# Patient Record
Sex: Female | Born: 1945
Health system: Southern US, Community
[De-identification: ages and names within clinical notes are randomized; demographics above are authoritative.]

## PROBLEM LIST (undated history)

## (undated) DIAGNOSIS — I10 Essential (primary) hypertension: Secondary | ICD-10-CM

## (undated) DIAGNOSIS — R002 Palpitations: Secondary | ICD-10-CM

## (undated) DIAGNOSIS — K635 Polyp of colon: Secondary | ICD-10-CM

## (undated) DIAGNOSIS — C4491 Basal cell carcinoma of skin, unspecified: Secondary | ICD-10-CM

## (undated) DIAGNOSIS — E785 Hyperlipidemia, unspecified: Secondary | ICD-10-CM

## (undated) DIAGNOSIS — I471 Supraventricular tachycardia, unspecified: Secondary | ICD-10-CM

## (undated) DIAGNOSIS — I4719 Other supraventricular tachycardia: Secondary | ICD-10-CM

## (undated) DIAGNOSIS — M549 Dorsalgia, unspecified: Secondary | ICD-10-CM

## (undated) DIAGNOSIS — K589 Irritable bowel syndrome without diarrhea: Secondary | ICD-10-CM

## (undated) DIAGNOSIS — J309 Allergic rhinitis, unspecified: Secondary | ICD-10-CM

## (undated) DIAGNOSIS — C4492 Squamous cell carcinoma of skin, unspecified: Secondary | ICD-10-CM

## (undated) HISTORY — DX: Polyp of colon: K63.5

## (undated) HISTORY — DX: Palpitations: R00.2

## (undated) HISTORY — DX: Irritable bowel syndrome, unspecified: K58.9

## (undated) HISTORY — DX: Hypercalcemia: E83.52

## (undated) HISTORY — DX: Basal cell carcinoma of skin, unspecified: C44.91

## (undated) HISTORY — DX: Hyperlipidemia, unspecified: E78.5

## (undated) HISTORY — DX: Squamous cell carcinoma of skin, unspecified: C44.92

## (undated) HISTORY — DX: Allergic rhinitis, unspecified: J30.9

## (undated) HISTORY — DX: Supraventricular tachycardia: I47.1

## (undated) HISTORY — DX: Dorsalgia, unspecified: M54.9

## (undated) HISTORY — DX: Essential (primary) hypertension: I10

## (undated) HISTORY — DX: Other supraventricular tachycardia: I47.19

## (undated) HISTORY — DX: Supraventricular tachycardia, unspecified: I47.10

---

## 1975-03-05 HISTORY — PX: APPENDECTOMY: SHX54

## 1975-03-05 HISTORY — PX: CHOLECYSTECTOMY: SHX55

## 1998-07-05 ENCOUNTER — Other Ambulatory Visit: Admission: RE | Admit: 1998-07-05 | Discharge: 1998-07-05 | Payer: Self-pay | Admitting: *Deleted

## 1999-07-16 ENCOUNTER — Other Ambulatory Visit: Admission: RE | Admit: 1999-07-16 | Discharge: 1999-07-16 | Payer: Self-pay | Admitting: *Deleted

## 2000-07-22 ENCOUNTER — Other Ambulatory Visit: Admission: RE | Admit: 2000-07-22 | Discharge: 2000-07-22 | Payer: Self-pay | Admitting: *Deleted

## 2001-09-01 ENCOUNTER — Other Ambulatory Visit: Admission: RE | Admit: 2001-09-01 | Discharge: 2001-09-01 | Payer: Self-pay | Admitting: Obstetrics and Gynecology

## 2002-10-25 ENCOUNTER — Other Ambulatory Visit: Admission: RE | Admit: 2002-10-25 | Discharge: 2002-10-25 | Payer: Self-pay | Admitting: Obstetrics and Gynecology

## 2002-11-03 HISTORY — PX: ENDOMETRIAL BIOPSY: SHX622

## 2004-12-25 ENCOUNTER — Encounter: Admission: RE | Admit: 2004-12-25 | Discharge: 2004-12-25 | Payer: Self-pay | Admitting: Obstetrics and Gynecology

## 2007-04-29 ENCOUNTER — Encounter: Admission: RE | Admit: 2007-04-29 | Discharge: 2007-04-29 | Payer: Self-pay | Admitting: Obstetrics and Gynecology

## 2010-03-24 ENCOUNTER — Encounter: Payer: Self-pay | Admitting: Obstetrics and Gynecology

## 2013-05-21 ENCOUNTER — Encounter: Payer: Self-pay | Admitting: Cardiology

## 2013-05-21 ENCOUNTER — Encounter: Payer: Self-pay | Admitting: General Surgery

## 2013-05-21 ENCOUNTER — Ambulatory Visit (INDEPENDENT_AMBULATORY_CARE_PROVIDER_SITE_OTHER): Payer: Medicare Other | Admitting: Cardiology

## 2013-05-21 ENCOUNTER — Emergency Department (HOSPITAL_COMMUNITY): Payer: Medicare Other

## 2013-05-21 ENCOUNTER — Encounter (HOSPITAL_COMMUNITY): Payer: Self-pay | Admitting: Emergency Medicine

## 2013-05-21 ENCOUNTER — Emergency Department (HOSPITAL_COMMUNITY)
Admission: EM | Admit: 2013-05-21 | Discharge: 2013-05-21 | Disposition: A | Discharge status: Home / Self Care | Payer: Medicare Other | Attending: Emergency Medicine | Admitting: Emergency Medicine

## 2013-05-21 VITALS — BP 153/83 | HR 137 | Ht 67.0 in | Wt 192.0 lb

## 2013-05-21 DIAGNOSIS — Z860100 Personal history of colon polyps, unspecified: Secondary | ICD-10-CM | POA: Insufficient documentation

## 2013-05-21 DIAGNOSIS — Z79899 Other long term (current) drug therapy: Secondary | ICD-10-CM | POA: Diagnosis not present

## 2013-05-21 DIAGNOSIS — Z8639 Personal history of other endocrine, nutritional and metabolic disease: Secondary | ICD-10-CM | POA: Insufficient documentation

## 2013-05-21 DIAGNOSIS — I471 Supraventricular tachycardia: Secondary | ICD-10-CM

## 2013-05-21 DIAGNOSIS — R002 Palpitations: Secondary | ICD-10-CM | POA: Diagnosis not present

## 2013-05-21 DIAGNOSIS — Z8719 Personal history of other diseases of the digestive system: Secondary | ICD-10-CM | POA: Insufficient documentation

## 2013-05-21 DIAGNOSIS — Z880 Allergy status to penicillin: Secondary | ICD-10-CM | POA: Insufficient documentation

## 2013-05-21 DIAGNOSIS — Z85828 Personal history of other malignant neoplasm of skin: Secondary | ICD-10-CM | POA: Diagnosis not present

## 2013-05-21 DIAGNOSIS — Z862 Personal history of diseases of the blood and blood-forming organs and certain disorders involving the immune mechanism: Secondary | ICD-10-CM | POA: Insufficient documentation

## 2013-05-21 DIAGNOSIS — Z7982 Long term (current) use of aspirin: Secondary | ICD-10-CM | POA: Insufficient documentation

## 2013-05-21 DIAGNOSIS — R000 Tachycardia, unspecified: Secondary | ICD-10-CM | POA: Diagnosis not present

## 2013-05-21 DIAGNOSIS — I498 Other specified cardiac arrhythmias: Secondary | ICD-10-CM | POA: Diagnosis not present

## 2013-05-21 LAB — BASIC METABOLIC PANEL
BUN: 25 mg/dL — ABNORMAL HIGH (ref 6–23)
CO2: 28 mEq/L (ref 19–32)
Calcium: 10.6 mg/dL — ABNORMAL HIGH (ref 8.4–10.5)
Chloride: 103 mEq/L (ref 96–112)
Creatinine, Ser: 0.66 mg/dL (ref 0.50–1.10)
GFR calc Af Amer: >90 mL/min (ref >90)
GFR calc non Af Amer: >90 mL/min (ref >90)
Glucose, Bld: 81 mg/dL (ref 70–99)
Potassium: 4.1 mEq/L (ref 3.7–5.3)
Sodium: 145 mEq/L (ref 137–147)

## 2013-05-21 LAB — CBC
HCT: 47.7 % — ABNORMAL HIGH (ref 36.0–46.0)
Hemoglobin: 16.6 g/dL — ABNORMAL HIGH (ref 12.0–15.0)
MCH: 32 pg (ref 26.0–34.0)
MCHC: 34.8 g/dL (ref 30.0–36.0)
MCV: 92.1 fL (ref 78.0–100.0)
Platelets: 273 10*3/uL (ref 150–400)
RBC: 5.18 MIL/uL — ABNORMAL HIGH (ref 3.87–5.11)
RDW: 13.8 % (ref 11.5–15.5)
WBC: 7.8 10*3/uL (ref 4.0–10.5)

## 2013-05-21 LAB — I-STAT TROPONIN, ED: Troponin i, poc: 0 ng/mL (ref 0.00–0.08)

## 2013-05-21 MED ORDER — DILTIAZEM HCL 25 MG/5ML IV SOLN
20.0000 mg | Freq: Once | INTRAVENOUS | Status: AC
  Administered 2013-05-21: 20 mg via INTRAVENOUS
  Filled 2013-05-21: qty 5

## 2013-05-21 MED ORDER — DILTIAZEM HCL ER COATED BEADS 120 MG PO CP24: 120.0000 mg | ORAL_CAPSULE | Freq: Every day | ORAL | Status: DC

## 2013-05-21 MED ORDER — DILTIAZEM HCL 60 MG PO TABS
120.0000 mg | ORAL_TABLET | Freq: Once | ORAL | Status: DC
  Filled 2013-05-21: qty 2

## 2013-05-21 MED ORDER — DILTIAZEM HCL ER COATED BEADS 120 MG PO CP24
120.0000 mg | ORAL_CAPSULE | Freq: Once | ORAL | Status: AC
  Administered 2013-05-21: 120 mg via ORAL
  Filled 2013-05-21: qty 1

## 2013-05-21 NOTE — ED Notes (Addendum)
Pt reports Sunday night she was sleeping and woke with her heart racing. Denies any pain. States shes had racing heart in past that will stop after a short time but on Sunday night the racing heart persisted "For a while." she went to see heart doctor this am and they found she was in SVT in office and wanted her to come straight to the ED

## 2013-05-21 NOTE — ED Provider Notes (Signed)
CSN: [id number]     Arrival date & time 05/21/13  1021 History   First MD Initiated Contact with Patient 05/21/13 1105     Chief Complaint  Patient presents with  . Tachycardia     (Consider location/radiation/quality/duration/timing/severity/associated sxs/prior Treatment) HPI Comments: Patient presents with palpitations. She states that Sunday which was 6 days ago she noticed that her heart was racing in the evening. This persisted through most of the night. She's been having intermittent episodes of palpitations that'll last up to one to 2 hours. She denies any associated symptoms. There is no chest tightness, shortness of breath or dizziness associated with it. She reports she's had occasionally in the past but has been usually short lived. She denies any known history of heart disease. She went to see Dr. Fransico Him today in the office and was noted to be in SVT. She was sent over here for possible attendance in her cardioversion. When she presented to the ED she was in a sinus rhythm at a rate in the 70s.   Past Medical History  Diagnosis Date  . Basal cell carcinoma   . IBS (irritable bowel syndrome)   . Back pain   . Hyperlipidemia   . Squamous cell skin cancer   . Colon polyp     Hyperplastic cecal polyps   Past Surgical History  Procedure Laterality Date  . Appendectomy    . Cholecystectomy     Family History  Problem Relation Age of Onset  . Hypertension Mother   . Hypertension Father    History  Substance Use Topics  . Smoking status: Never Smoker   . Smokeless tobacco: Not on file  . Alcohol Use: Yes   OB History   Grav Para Term Preterm Abortions TAB SAB Ect Mult Living                 Review of Systems  Constitutional: Negative for fever, chills, diaphoresis and fatigue.  HENT: Negative for congestion, rhinorrhea and sneezing.   Eyes: Negative.   Respiratory: Negative for cough, chest tightness and shortness of breath.   Cardiovascular: Positive for  palpitations. Negative for chest pain and leg swelling.  Gastrointestinal: Negative for nausea, vomiting, abdominal pain, diarrhea and blood in stool.  Genitourinary: Negative for frequency, hematuria, flank pain and difficulty urinating.  Musculoskeletal: Negative for arthralgias and back pain.  Skin: Negative for rash.  Neurological: Negative for dizziness, speech difficulty, weakness, numbness and headaches.      Allergies  Codeine and Penicillins  Home Medications   Current Outpatient Rx  Name  Route  Sig  Dispense  Refill  . acetaminophen (TYLENOL) 325 MG tablet   Oral   Take 650 mg by mouth every 6 (six) hours as needed for mild pain.          Marland Kitchen aspirin 81 MG tablet   Oral   Take 81 mg by mouth daily.         . Cholecalciferol (VITAMIN D-3) 1000 UNITS CAPS   Oral   Take 1 capsule by mouth daily.         Marland Kitchen ibuprofen (ADVIL,MOTRIN) 200 MG tablet   Oral   Take 400 mg by mouth every 6 (six) hours as needed for moderate pain.         . Multiple Vitamin (MULTIVITAMIN) tablet   Oral   Take 1 tablet by mouth daily.         . Omega-3 Fatty Acids (FISH OIL) 1000 MG  CAPS   Oral   Take 1 capsule by mouth.         . sodium chloride (OCEAN) 0.65 % SOLN nasal spray   Each Nare   Place 1 spray into both nostrils as needed for congestion.         . valsartan (DIOVAN) 160 MG tablet   Oral   Take 160 mg by mouth daily.         Marland Kitchen diltiazem (CARDIZEM CD) 120 MG 24 hr capsule   Oral   Take 1 capsule (120 mg total) by mouth daily.   30 capsule   0    BP 127/70  Pulse 66  Temp(Src) 98.9 F (37.2 C) (Oral)  Resp 16  Ht 5\' 7"  (1.702 m)  Wt 190 lb 4.8 oz (86.32 kg)  BMI 29.80 kg/m2  SpO2 99% Physical Exam  Constitutional: She is oriented to person, place, and time. She appears well-developed and well-nourished.  HENT:  Head: Normocephalic and atraumatic.  Eyes: Pupils are equal, round, and reactive to light.  Neck: Normal range of motion. Neck supple.   Cardiovascular: Normal rate, regular rhythm and normal heart sounds.   Pulmonary/Chest: Effort normal and breath sounds normal. No respiratory distress. She has no wheezes. She has no rales. She exhibits no tenderness.  Abdominal: Soft. Bowel sounds are normal. There is no tenderness. There is no rebound and no guarding.  Musculoskeletal: Normal range of motion. She exhibits no edema.  Lymphadenopathy:    She has no cervical adenopathy.  Neurological: She is alert and oriented to person, place, and time.  Skin: Skin is warm and dry. No rash noted.  Psychiatric: She has a normal mood and affect.    ED Course  Procedures (including critical care time) Labs Review Results for orders placed during the hospital encounter of 05/21/13  CBC      Result Value Ref Range   WBC 7.8  4.0 - 10.5 K/uL   RBC 5.18 (*) 3.87 - 5.11 MIL/uL   Hemoglobin 16.6 (*) 12.0 - 15.0 g/dL   HCT 47.7 (*) 36.0 - 46.0 %   MCV 92.1  78.0 - 100.0 fL   MCH 32.0  26.0 - 34.0 pg   MCHC 34.8  30.0 - 36.0 g/dL   RDW 13.8  11.5 - 15.5 %   Platelets 273  150 - 400 K/uL  BASIC METABOLIC PANEL      Result Value Ref Range   Sodium 145  137 - 147 mEq/L   Potassium 4.1  3.7 - 5.3 mEq/L   Chloride 103  96 - 112 mEq/L   CO2 28  19 - 32 mEq/L   Glucose, Bld 81  70 - 99 mg/dL   BUN 25 (*) 6 - 23 mg/dL   Creatinine, Ser 0.66  0.50 - 1.10 mg/dL   Calcium 10.6 (*) 8.4 - 10.5 mg/dL   GFR calc non Af Amer >90  >90 mL/min   GFR calc Af Amer >90  >90 mL/min  I-STAT TROPOININ, ED      Result Value Ref Range   Troponin i, poc 0.00  0.00 - 0.08 ng/mL   Comment 3            Dg Chest 2 View  05/21/2013   CLINICAL DATA:  Tachycardia.  EXAM: CHEST  2 VIEW  COMPARISON:  None.  FINDINGS: The cardiomediastinal silhouette is within normal limits. The lungs are well inflated and clear. There is no evidence of pleural effusion or  pneumothorax. No acute osseous abnormality is identified.  IMPRESSION: No active cardiopulmonary disease.    Electronically Signed   By: Logan Bores   On: 05/21/2013 11:00     Imaging Review Dg Chest 2 View  05/21/2013   CLINICAL DATA:  Tachycardia.  EXAM: CHEST  2 VIEW  COMPARISON:  None.  FINDINGS: The cardiomediastinal silhouette is within normal limits. The lungs are well inflated and clear. There is no evidence of pleural effusion or pneumothorax. No acute osseous abnormality is identified.  IMPRESSION: No active cardiopulmonary disease.   Electronically Signed   By: Logan Bores   On: 05/21/2013 11:00     EKG Interpretation   Date/Time:  Friday May 21 2013 11:21:12 EDT Ventricular Rate:  143 PR Interval:    QRS Duration: 83 QT Interval:  315 QTC Calculation: 486 R Axis:   -48 Text Interpretation:  atrial tachycardia Left anterior fascicular block  Low voltage, precordial leads Minimal ST depression, diffuse leads  Borderline prolonged QT interval changed from prior tracing Confirmed by  Andromeda Poppen  MD, Jozalynn Noyce ([id number]) on 05/21/2013 12:01:54 PM      MDM   Final diagnoses:  Atrial tachycardia    Patient presents in transient SVT. She easily temperature with a Valsalva maneuver but then she goes right back and SVT. She is frequently switching between a sinus rhythm and SVT. Given this, I did not feel that this would be very beneficial as she keeps putting back and forth between a sinus rhythm and SVT. I discussed this with Dr. Radford Pax who recommended I consult the cardiologist on call. Dr. Caryl Comes, the electrophysiologist on call came down and this looked at the patient's rhythms. He feels that the patient is going into atrial tachycardia. He recommends starting the patient on a Cardizem bolus as well as an oral dose of Cardizem. If she sustains a sinus rhythm following that she can possibly go home. However if she keeps flipping back and forth we'll likely admit her.  14:41 patient is doing well after the Cardizem. She's not had any further episodes of tachycardia. I consulted again with Dr.  Caryl Comes in the head and discharge her on oral Cardizem. The cardiology group is going to arrange follow up with an electrophysiologist. I advised her to keep an eye on her blood pressure to make sure that it doesn't go too low. Advised to return if her symptoms worsen.  CRITICAL CARE Performed by: Gianni Fuchs Total critical care time: 60 Critical care time was exclusive of separately billable procedures and treating other patients. Critical care was necessary to treat or prevent imminent or life-threatening deterioration. Critical care was time spent personally by me on the following activities: development of treatment plan with patient and/or surrogate as well as nursing, discussions with consultants, evaluation of patient's response to treatment, examination of patient, obtaining history from patient or surrogate, ordering and performing treatments and interventions, ordering and review of laboratory studies, ordering and review of radiographic studies, pulse oximetry and re-evaluation of patient's condition.   Malvin Johns, MD 05/21/13 [phone]

## 2013-05-21 NOTE — Patient Instructions (Signed)
Your physician  Recommends that you go over to [facility] ER right after this visit today

## 2013-05-21 NOTE — ED Notes (Signed)
Pt received Cardizem 20 mg total, 10 mg given over 5 mins BP rechecked then 10 mg given over 5 mins, BP 138/68 at beginning of admin, 129/60 at mid point & 137/63 at the end

## 2013-05-21 NOTE — ED Notes (Signed)
MD at bedside. 

## 2013-05-21 NOTE — ED Notes (Signed)
Caryl Comes, MD cardiology in ED with consult with Tamera Punt, MD, EKG review completed,  Plan of care discussed

## 2013-05-21 NOTE — Progress Notes (Signed)
[place], [place], [place]  [address] Phone: [phone] Fax:  [phone]  Date:  05/21/2013   ID:  Denise Hebert, DOB 22-Apr-1945, MRN [id number]  PCP:  Antony Blackbird, MD  Cardiologist:  Fransico Him, MD     History of Present Illness: Denise Hebert is a 68 y.o. female with no prior history of cardiac disease who presents today with palpitations.  She says that she went to bed this past Sunday night feeling fine and awakened in the middle of the night with palpitations.  She has had palpitations in the past intermittently but never lasting more than 1 minute.  She got up out of bed Sunday night and sat on the couch.  She says that it calmed down but has not been able to sleep lying down at all this week because she can feel it racing.  She thinks it has been intermittent throughout the week.  She describes it as a fluttering.  She denies any dizziness, chest pain or SOB with the palpitations.   Wt Readings from Last 3 Encounters:  05/21/13 192 lb (87.091 kg)     Past Medical History  Diagnosis Date  . Basal cell carcinoma   . IBS (irritable bowel syndrome)   . Back pain   . Hyperlipidemia   . Squamous cell skin cancer   . Colon polyp     Hyperplastic cecal polyps    Current Outpatient Prescriptions  Medication Sig Dispense Refill  . acetaminophen (TYLENOL) 325 MG tablet Take 650 mg by mouth every 6 (six) hours as needed.      Marland Kitchen aspirin 81 MG tablet Take 81 mg by mouth daily.      . Cholecalciferol (VITAMIN D-3) 1000 UNITS CAPS Take 1 capsule by mouth daily.      . Multiple Vitamin (MULTIVITAMIN) tablet Take 1 tablet by mouth daily.      . Omega-3 Fatty Acids (FISH OIL) 1000 MG CAPS Take 1 capsule by mouth.      . sodium chloride (OCEAN) 0.65 % SOLN nasal spray Place 1 spray into both nostrils as needed for congestion.      . valsartan (DIOVAN) 160 MG tablet Take 160 mg by mouth daily.       No current facility-administered  medications for this visit.    Allergies:    Allergies  Allergen Reactions  . Codeine     GI intolerance   . Penicillins Hives    Social History:  The patient  reports that she has never smoked. She does not have any smokeless tobacco history on file. She reports that she drinks alcohol. She reports that she does not use illicit drugs.   Family History:  The patient's family history includes Hypertension in her father and mother.   ROS:  Please see the history of present illness.      All other systems reviewed and negative.   PHYSICAL EXAM: VS:  BP 153/83  Pulse 137  Ht 5\' 7"  (1.702 m)  Wt 192 lb (87.091 kg)  BMI 30.06 kg/m2 Well nourished, well developed, in no acute distress HEENT: normal Neck: no JVD Cardiac:  normal S1, S2; Regular and tachycardic; no murmur Lungs:  clear to auscultation bilaterally, no wheezing, rhonchi or rales Abd: soft, nontender, no hepatomegaly Ext: no edema Skin: warm and dry Neuro:  CNs 2-12 intact, no focal abnormalities noted  EKG:  SVT at 137bpm     ASSESSMENT AND PLAN:  1.  SVT at 137bpm that has occurred for about 1 week.  - will send to ER for Adenosine IV to try to convert  Signed, Fransico Him, MD 05/21/2013 9:46 AM

## 2013-05-21 NOTE — ED Notes (Signed)
Pt ambulated without difficulty, remains in NSR with ambulation, Belfi, MD aware

## 2013-05-21 NOTE — ED Notes (Signed)
Belfi, MD at bedside.  

## 2013-05-25 ENCOUNTER — Other Ambulatory Visit: Payer: Self-pay | Admitting: *Deleted

## 2013-05-25 MED ORDER — DILTIAZEM HCL ER COATED BEADS 120 MG PO CP24: 120.0000 mg | ORAL_CAPSULE | Freq: Every day | ORAL | Status: DC

## 2013-06-11 ENCOUNTER — Ambulatory Visit (INDEPENDENT_AMBULATORY_CARE_PROVIDER_SITE_OTHER): Payer: Medicare Other | Admitting: Internal Medicine

## 2013-06-11 ENCOUNTER — Encounter: Payer: Self-pay | Admitting: Internal Medicine

## 2013-06-11 VITALS — BP 157/81 | HR 76 | Ht 67.0 in | Wt 191.0 lb

## 2013-06-11 DIAGNOSIS — I498 Other specified cardiac arrhythmias: Secondary | ICD-10-CM

## 2013-06-11 DIAGNOSIS — I10 Essential (primary) hypertension: Secondary | ICD-10-CM | POA: Diagnosis not present

## 2013-06-11 DIAGNOSIS — R002 Palpitations: Secondary | ICD-10-CM

## 2013-06-11 DIAGNOSIS — I471 Supraventricular tachycardia, unspecified: Secondary | ICD-10-CM

## 2013-06-11 MED ORDER — DILTIAZEM HCL ER COATED BEADS 120 MG PO CP24: 120.0000 mg | ORAL_CAPSULE | Freq: Every day | ORAL | Status: DC

## 2013-06-11 NOTE — Patient Instructions (Signed)
Your physician recommends that you schedule a follow-up appointment in: 3 months with Dr Allred   Your physician has requested that you have an echocardiogram. Echocardiography is a painless test that uses sound waves to create images of your heart. It provides your doctor with information about the size and shape of your heart and how well your heart's chambers and valves are working. This procedure takes approximately one hour. There are no restrictions for this procedure.      

## 2013-06-13 DIAGNOSIS — I10 Essential (primary) hypertension: Secondary | ICD-10-CM | POA: Insufficient documentation

## 2013-06-13 NOTE — Progress Notes (Signed)
Primary Care Physician: Antony Blackbird, MD Referring Physician:  Dr Jim Like Denise Hebert is a 68 y.o. female with a h/o SVT who presents for EP consultation.  She reports typically being in very good health. She has recently developed symptoms of abrupt onset/ offset of tachypalpitations.  Episodes are worsened by stress.  Episodes have occurred intermittently for a year or so but have been much worse recently.  Typically episodes are short, lasting only 30 seconds to several minutes.  Due to increasing frequency and duration of episodes, she presented for evaluation by Dr Radford Pax 05/21/13.  During her visit, she was documented to have mid RP tachycardia at 145 bpm.  She was therefore sent to [facility] for further evaluation.  She received IV diltiazem and was placed on oral diltiazem at discharge.  She has done very well since, without any recurrence of her arrhythmia.    Today, she denies symptoms of chest pain, shortness of breath, orthopnea, PND, lower extremity edema, dizziness, presyncope, syncope, or neurologic sequela. The patient is tolerating medications without difficulties and is otherwise without complaint today.   Past Medical History  Diagnosis Date  . Basal cell carcinoma   . IBS (irritable bowel syndrome)   . Back pain   . Hyperlipidemia   . Squamous cell skin cancer   . Colon polyp     Hyperplastic cecal polyps  . Palpitations   . Atrial tachycardia   . SVT (supraventricular tachycardia)   . Allergic rhinitis, cause unspecified   . HTN (hypertension)   . Calcium increased serum   . Colon polyp, hyperplastic     Cecal   Past Surgical History  Procedure Laterality Date  . Appendectomy  1977  . Cholecystectomy  1977  . Endometrial biopsy  11/2002    Current Outpatient Prescriptions  Medication Sig Dispense Refill  . acetaminophen (TYLENOL) 325 MG tablet Take 650 mg by mouth every 6 (six) hours as needed for mild pain.       Marland Kitchen aspirin 81 MG tablet  Take 81 mg by mouth daily.      . Cholecalciferol (VITAMIN D-3) 1000 UNITS CAPS Take 1 capsule by mouth daily.      Marland Kitchen diltiazem (CARDIZEM CD) 120 MG 24 hr capsule Take 1 capsule (120 mg total) by mouth daily.  90 capsule  3  . ibuprofen (ADVIL,MOTRIN) 200 MG tablet Take 400 mg by mouth every 6 (six) hours as needed for moderate pain.      . Multiple Vitamin (MULTIVITAMIN) tablet Take 1 tablet by mouth daily.      . Omega-3 Fatty Acids (FISH OIL) 1000 MG CAPS Take 1 capsule by mouth.      . sodium chloride (OCEAN) 0.65 % SOLN nasal spray Place 1 spray into both nostrils as needed for congestion.      . valsartan (DIOVAN) 160 MG tablet Take 160 mg by mouth daily.       No current facility-administered medications for this visit.    Allergies  Allergen Reactions  . Codeine     GI intolerance   . Penicillins Hives    History   Social History  . Marital Status: Married    Spouse Name: N/A    Number of Children: N/A  . Years of Education: N/A   Occupational History  . Not on file.   Social History Main Topics  . Smoking status: Never Smoker   . Smokeless tobacco: Not on file  . Alcohol Use: Yes  .  Drug Use: No  . Sexual Activity: Not on file   Other Topics Concern  . Not on file   Social History Narrative  . No narrative on file    Family History  Problem Relation Age of Onset  . Hypertension Mother   . Hypertension Father   . Congestive Heart Failure Father   . Colon cancer Father   . Diabetes Mellitus II Father   . Alzheimer's disease Mother     Feb. 2011  . Osteoarthritis Sister     ROS- All systems are reviewed and negative except as per the HPI above  Physical Exam: Filed Vitals:   06/11/13 1022  BP: 157/81  Pulse: 76  Height: 5\' 7"  (1.702 m)  Weight: 191 lb (86.637 kg)    GEN- The patient is well appearing, alert and oriented x 3 today.   Head- normocephalic, atraumatic Eyes-  Sclera clear, conjunctiva pink Ears- hearing intact Oropharynx-  clear Neck- supple, no JVP Lymph- no cervical lymphadenopathy Lungs- Clear to ausculation bilaterally, normal work of breathing Heart- Regular rate and rhythm, no murmurs, rubs or gallops, PMI not laterally displaced GI- soft, NT, ND, + BS Extremities- no clubbing, cyanosis, or edema MS- no significant deformity or atrophy Skin- no rash or lesion Psych- euthymic mood, full affect Neuro- strength and sensation are intact  EKG today reveals sinus rhythm 76 bpm, rsr/, LAD, otherwise normal ekg Dr Landis Gandy notes and ekgs are reviewed  Assessment and Plan:  1. SVT The patient has been documented to have a mid RP tachycardia.  Though this is likely atrial tachycardia, I cannot exclude a reentrant arrhythmia.  Therapeutic strategies for supraventricular tachycardia including medicine and ablation were discussed in detail with the patient today. Risk, benefits, and alternatives to EP study and radiofrequency ablation were also discussed in detail today.  Presently, she is pleased with the results of diltiazem and would like to continue medical therapy.  She may consider ablation if her arrhythmias increase in the future. I will order an echo to evaluate for any structural heart disease as the cause for her arrhythmia.  2. HTN Above goal today She feels that her BP is controlled at home and does not wish to make changes presently Echo as above  Return in 3 months for follow-up

## 2013-06-22 ENCOUNTER — Ambulatory Visit (HOSPITAL_COMMUNITY): Payer: Medicare Other | Attending: Internal Medicine | Admitting: Radiology

## 2013-06-22 DIAGNOSIS — R002 Palpitations: Secondary | ICD-10-CM | POA: Insufficient documentation

## 2013-06-22 DIAGNOSIS — I498 Other specified cardiac arrhythmias: Secondary | ICD-10-CM | POA: Diagnosis not present

## 2013-06-22 DIAGNOSIS — I471 Supraventricular tachycardia: Secondary | ICD-10-CM

## 2013-06-22 NOTE — Progress Notes (Signed)
Echocardiogram Performed. 

## 2013-06-23 ENCOUNTER — Institutional Professional Consult (permissible substitution): Payer: Medicare Other | Admitting: Internal Medicine

## 2013-06-28 DIAGNOSIS — I498 Other specified cardiac arrhythmias: Secondary | ICD-10-CM | POA: Diagnosis not present

## 2013-06-28 DIAGNOSIS — I10 Essential (primary) hypertension: Secondary | ICD-10-CM | POA: Diagnosis not present

## 2013-06-28 DIAGNOSIS — K5900 Constipation, unspecified: Secondary | ICD-10-CM | POA: Diagnosis not present

## 2013-06-28 DIAGNOSIS — I519 Heart disease, unspecified: Secondary | ICD-10-CM | POA: Diagnosis not present

## 2013-07-27 DIAGNOSIS — M1710 Unilateral primary osteoarthritis, unspecified knee: Secondary | ICD-10-CM | POA: Diagnosis not present

## 2013-07-27 DIAGNOSIS — I10 Essential (primary) hypertension: Secondary | ICD-10-CM | POA: Diagnosis not present

## 2013-07-27 DIAGNOSIS — IMO0002 Reserved for concepts with insufficient information to code with codable children: Secondary | ICD-10-CM | POA: Diagnosis not present

## 2013-07-27 DIAGNOSIS — R609 Edema, unspecified: Secondary | ICD-10-CM | POA: Diagnosis not present

## 2013-08-02 ENCOUNTER — Ambulatory Visit (INDEPENDENT_AMBULATORY_CARE_PROVIDER_SITE_OTHER): Payer: Medicare Other | Admitting: Endocrinology

## 2013-08-02 ENCOUNTER — Encounter: Payer: Self-pay | Admitting: Endocrinology

## 2013-08-02 MED ORDER — FUROSEMIDE 20 MG PO TABS: 20.0000 mg | ORAL_TABLET | Freq: Every day | ORAL | Status: DC

## 2013-08-02 NOTE — Patient Instructions (Signed)
blood tests are being requested for you today.  We'll contact you with results. Please change HCTZ to another water pill.  i have sent a prescription to your pharmacy If these blood tests do not reveal a cause of the high calcium, we'll next check a 24-HR urine for calcium, to see if the kidneys are not eliminating the calcium.

## 2013-08-02 NOTE — Progress Notes (Signed)
Subjective:    Patient ID: Denise Hebert, female    DOB: [birth year], 68 y.o.   MRN: [id number]  HPI Pt was first noted to have hypercalcemia in 2013.  she has never had urolithiasis, PUD, pancreatitis, depression, or bony fracture.  He does not take vitamin-D or A supplements.  She stopped caltrate-D last year.  She stopped ergocalciferol 1 month ago.  She has no h/o sarcoidosis, cancer, prolonged immobilization, hyperthyroidism, rhabdomyolysis, or osteoporosis.  She has 1 year of moderate pain at the right knee, or assoc numbness.  She does not take antacids or Li++.    Past Medical History  Diagnosis Date  . Basal cell carcinoma   . IBS (irritable bowel syndrome)   . Back pain   . Hyperlipidemia   . Squamous cell skin cancer   . Colon polyp     Hyperplastic cecal polyps  . Palpitations   . Atrial tachycardia   . SVT (supraventricular tachycardia)   . Allergic rhinitis, cause unspecified   . HTN (hypertension)   . Calcium increased serum   . Colon polyp, hyperplastic     Cecal    Past Surgical History  Procedure Laterality Date  . Appendectomy  1977  . Cholecystectomy  1977  . Endometrial biopsy  11/2002    History   Social History  . Marital Status: Married    Spouse Name: N/A    Number of Children: N/A  . Years of Education: N/A   Occupational History  . Not on file.   Social History Main Topics  . Smoking status: Never Smoker   . Smokeless tobacco: Not on file  . Alcohol Use: Yes  . Drug Use: No  . Sexual Activity: Not on file   Other Topics Concern  . Not on file   Social History Narrative  . No narrative on file    Current Outpatient Prescriptions on File Prior to Visit  Medication Sig Dispense Refill  . acetaminophen (TYLENOL) 325 MG tablet Take 650 mg by mouth every 6 (six) hours as needed for mild pain.       Marland Kitchen aspirin 81 MG tablet Take 81 mg by mouth daily.      Marland Kitchen diltiazem (CARDIZEM CD) 120 MG 24 hr capsule Take 1 capsule (120  mg total) by mouth daily.  90 capsule  3  . ibuprofen (ADVIL,MOTRIN) 200 MG tablet Take 400 mg by mouth every 6 (six) hours as needed for moderate pain.      . Multiple Vitamin (MULTIVITAMIN) tablet Take 1 tablet by mouth daily.      . Omega-3 Fatty Acids (FISH OIL) 1000 MG CAPS Take 1 capsule by mouth.      . sodium chloride (OCEAN) 0.65 % SOLN nasal spray Place 1 spray into both nostrils as needed for congestion.      . valsartan (DIOVAN) 160 MG tablet Take 160 mg by mouth daily.      . Cholecalciferol (VITAMIN D-3) 1000 UNITS CAPS Take 1 capsule by mouth daily.       No current facility-administered medications on file prior to visit.    Allergies  Allergen Reactions  . Codeine     GI intolerance   . Penicillins Hives    Family History  Problem Relation Age of Onset  . Hypertension Mother   . Hypertension Father   . Congestive Heart Failure Father   . Colon cancer Father   . Diabetes Mellitus II Father   . Alzheimer's disease  Mother     Feb. 2011  . Osteoarthritis Sister   no hypercalcemia.    BP 124/82  Pulse 80  Temp(Src) 98.4 F (36.9 C) (Oral)  Ht 5\' 7"  (1.702 m)  Wt 191 lb (86.637 kg)  BMI 29.91 kg/m2  SpO2 97%  Review of Systems denies galactorrhea, hematuria, memory loss, cold intolerance, seizure, abdominal pain, muscle weakness, urinary frequency, skin rash, visual loss, sob, rhinorrhea, easy bruising, and depression.  She takes hctz prn, for leg swelling.  She has constipation and weight gain.       Objective:   Physical Exam VS: see vs page GEN: no distress HEAD: head: no deformity eyes: no periorbital swelling, no proptosis external nose and ears are normal mouth: no lesion seen NECK: supple, thyroid is not enlarged CHEST WALL: no deformity.  No kyphosis. LUNGS:  Clear to auscultation.   CV: reg rate and rhythm, no murmur ABD: abdomen is soft, nontender.  no hepatosplenomegaly.  not distended.  no hernia MUSCULOSKELETAL: muscle bulk and strength  are grossly normal.  no obvious joint swelling.  gait is normal and steady EXTEMITIES: no deformity.  no ulcer on the feet.  feet are of normal color and temp.  no edema PULSES: dorsalis pedis intact bilat.  no carotid bruit NEURO:  cn 2-12 grossly intact.   readily moves all 4's.  sensation is intact to touch on the feet SKIN:  Normal texture and temperature.  No rash or suspicious lesion is visible.   NODES:  None palpable at the neck.   PSYCH: alert, well-oriented.  Does not appear anxious nor depressed.   outside records are reviewed:  Labs: Ca++=11 PTH=10 TSH=normal  CXR: (March, 2015): no lymphadenopathy.    Assessment & Plan:  Hypercalcemia, new, uncertain etiology Edema: the HCTZ can have the side-effect of rasing the Ca++ level.     Patient Instructions  blood tests are being requested for you today.  We'll contact you with results. Please change HCTZ to another water pill.  i have sent a prescription to your pharmacy If these blood tests do not reveal a cause of the high calcium, we'll next check a 24-HR urine for calcium, to see if the kidneys are not eliminating the calcium.

## 2013-08-03 LAB — VITAMIN D 25 HYDROXY (VIT D DEFICIENCY, FRACTURES): Vit D, 25-Hydroxy: 71 ng/mL (ref 30–89)

## 2013-08-04 LAB — PROTEIN ELECTROPHORESIS, SERUM
ALBUMIN ELP: 59.1 % (ref 55.8–66.1)
ALPHA-1-GLOBULIN: 3.6 % (ref 2.9–4.9)
Alpha-2-Globulin: 12.7 % — ABNORMAL HIGH (ref 7.1–11.8)
BETA 2: 5.3 % (ref 3.2–6.5)
BETA GLOBULIN: 7 % (ref 4.7–7.2)
Gamma Globulin: 12.3 % (ref 11.1–18.8)
TOTAL PROTEIN, SERUM ELECTROPHOR: 7.4 g/dL (ref 6.0–8.3)

## 2013-08-04 LAB — VITAMIN A: Vitamin A (Retinoic Acid): 65 ug/dL (ref 38–98)

## 2013-08-05 LAB — VITAMIN D 1,25 DIHYDROXY
Vitamin D 1, 25 (OH)2 Total: 55 pg/mL (ref 18–72)
Vitamin D2 1, 25 (OH)2: <8 pg/mL
Vitamin D3 1, 25 (OH)2: 55 pg/mL

## 2013-08-07 ENCOUNTER — Other Ambulatory Visit: Payer: Self-pay | Admitting: Endocrinology

## 2013-08-07 LAB — PTH-RELATED PEPTIDE: PTH-Related Protein (PTH-RP): 13 pg/mL — ABNORMAL LOW (ref 14–27)

## 2013-08-11 ENCOUNTER — Other Ambulatory Visit: Payer: Medicare Other

## 2013-08-12 LAB — CALCIUM, URINE, 24 HOUR
CALCIUM 24HR UR: 204 mg/d (ref 100–250)
CALCIUM UR: 12 mg/dL

## 2013-08-23 ENCOUNTER — Other Ambulatory Visit: Payer: Self-pay | Admitting: Endocrinology

## 2013-09-01 ENCOUNTER — Other Ambulatory Visit: Payer: Medicare Other

## 2013-09-01 DIAGNOSIS — Z1231 Encounter for screening mammogram for malignant neoplasm of breast: Secondary | ICD-10-CM | POA: Diagnosis not present

## 2013-09-01 DIAGNOSIS — Z803 Family history of malignant neoplasm of breast: Secondary | ICD-10-CM | POA: Diagnosis not present

## 2013-09-03 LAB — CALCIUM, URINE, 24 HOUR
CALCIUM 24H UR: 232 mg/(24.h) (ref 100.0–300.0)
CALCIUM UR: 11.6 mg/dL

## 2013-09-06 ENCOUNTER — Telehealth: Payer: Self-pay | Admitting: Endocrinology

## 2013-09-06 NOTE — Telephone Encounter (Signed)
Pt advised of labs.

## 2013-09-06 NOTE — Telephone Encounter (Signed)
Patient is returing call for lab results. Phone# [phone]

## 2013-09-06 NOTE — Telephone Encounter (Signed)
Requested call back.  

## 2013-09-09 ENCOUNTER — Ambulatory Visit (INDEPENDENT_AMBULATORY_CARE_PROVIDER_SITE_OTHER): Payer: Medicare Other | Admitting: Internal Medicine

## 2013-09-09 ENCOUNTER — Encounter: Payer: Self-pay | Admitting: Internal Medicine

## 2013-09-09 VITALS — BP 137/82 | HR 79 | Ht 67.0 in | Wt 191.1 lb

## 2013-09-09 DIAGNOSIS — I498 Other specified cardiac arrhythmias: Secondary | ICD-10-CM

## 2013-09-09 DIAGNOSIS — I471 Supraventricular tachycardia: Secondary | ICD-10-CM

## 2013-09-09 DIAGNOSIS — I10 Essential (primary) hypertension: Secondary | ICD-10-CM | POA: Diagnosis not present

## 2013-09-09 MED ORDER — FUROSEMIDE 20 MG PO TABS: 20.0000 mg | ORAL_TABLET | ORAL | Status: DC

## 2013-09-09 NOTE — Progress Notes (Signed)
PCP: Antony Blackbird, MD Primary Cardiologist:  Dr Prudence Davidson is a 68 y.o. female who presents today for routine electrophysiology followup.  Since last being seen in our clinic, the patient reports doing very well.  She is unaware of any further SVT.  She is tolerating diltiazem without difficulty.   Today, she denies symptoms of palpitations, chest pain, shortness of breath,  lower extremity edema, dizziness, presyncope, or syncope.  The patient is otherwise without complaint today.   Past Medical History  Diagnosis Date  . Basal cell carcinoma   . IBS (irritable bowel syndrome)   . Back pain   . Hyperlipidemia   . Squamous cell skin cancer   . Colon polyp     Hyperplastic cecal polyps  . Palpitations   . Atrial tachycardia   . SVT (supraventricular tachycardia)   . Allergic rhinitis, cause unspecified   . HTN (hypertension)   . Calcium increased serum   . Colon polyp, hyperplastic     Cecal   Past Surgical History  Procedure Laterality Date  . Appendectomy  1977  . Cholecystectomy  1977  . Endometrial biopsy  11/2002    ROS- all systems are reviewed and negatives except as per HPI above  Current Outpatient Prescriptions  Medication Sig Dispense Refill  . acetaminophen (TYLENOL) 325 MG tablet Take 650 mg by mouth every 6 (six) hours as needed for mild pain.       Marland Kitchen aspirin 81 MG tablet Take 81 mg by mouth daily.      Marland Kitchen diltiazem (CARDIZEM CD) 120 MG 24 hr capsule Take 1 capsule (120 mg total) by mouth daily.  90 capsule  3  . furosemide (LASIX) 20 MG tablet Take 20 mg by mouth daily.      Marland Kitchen ibuprofen (ADVIL,MOTRIN) 200 MG tablet Take 400 mg by mouth every 6 (six) hours as needed for moderate pain.      . Multiple Vitamin (MULTIVITAMIN) tablet Take 1 tablet by mouth daily.      . Omega-3 Fatty Acids (FISH OIL) 1000 MG CAPS Take 1 capsule by mouth.      . sodium chloride (OCEAN) 0.65 % SOLN nasal spray Place 1 spray into both nostrils as needed for congestion.       . valsartan (DIOVAN) 160 MG tablet Take 160 mg by mouth daily.       No current facility-administered medications for this visit.    Physical Exam: Filed Vitals:   09/09/13 1349  BP: 137/82  Pulse: 79  Height: 5\' 7"  (1.702 m)  Weight: 191 lb 1.9 oz (86.691 kg)    GEN- The patient is well appearing, alert and oriented x 3 today.   Head- normocephalic, atraumatic Eyes-  Sclera clear, conjunctiva pink Ears- hearing intact Oropharynx- clear Lungs- Clear to ausculation bilaterally, normal work of breathing Heart- Regular rate and rhythm, no murmurs, rubs or gallops, PMI not laterally displaced GI- soft, NT, ND, + BS Extremities- no clubbing, cyanosis, or edema  ekg today reveals sinus rhythm with pacs, RBBB Echo is reviewed with the patient today  Assessment and Plan:  1. Svt Well controlled No further workup planned  2. htn Controlled Occasional low blood pressure readings (SBP < 100 mm Hg) at home Decrease lasix to QOD.  Consider stopping lasix if no further edema upon return  Return in 6 months

## 2013-09-09 NOTE — Patient Instructions (Signed)
Your physician has recommended you make the following change in your medication:  TAKE YOUR LASIX EVERY OTHER DAY INSTEAD OF EVERY DAY.  Your physician wants you to follow-up in: [place].   You will receive a reminder letter in the mail two months in advance. If you don't receive a letter, please call our office to schedule the follow-up appointment.

## 2013-09-14 DIAGNOSIS — Z85828 Personal history of other malignant neoplasm of skin: Secondary | ICD-10-CM | POA: Diagnosis not present

## 2013-09-14 DIAGNOSIS — D2370 Other benign neoplasm of skin of unspecified lower limb, including hip: Secondary | ICD-10-CM | POA: Diagnosis not present

## 2013-09-14 DIAGNOSIS — L723 Sebaceous cyst: Secondary | ICD-10-CM | POA: Diagnosis not present

## 2013-09-14 DIAGNOSIS — D239 Other benign neoplasm of skin, unspecified: Secondary | ICD-10-CM | POA: Diagnosis not present

## 2013-09-14 DIAGNOSIS — L819 Disorder of pigmentation, unspecified: Secondary | ICD-10-CM | POA: Diagnosis not present

## 2013-09-14 DIAGNOSIS — L821 Other seborrheic keratosis: Secondary | ICD-10-CM | POA: Diagnosis not present

## 2013-09-14 DIAGNOSIS — D1801 Hemangioma of skin and subcutaneous tissue: Secondary | ICD-10-CM | POA: Diagnosis not present

## 2013-10-08 DIAGNOSIS — Z124 Encounter for screening for malignant neoplasm of cervix: Secondary | ICD-10-CM | POA: Diagnosis not present

## 2013-10-08 DIAGNOSIS — Z01419 Encounter for gynecological examination (general) (routine) without abnormal findings: Secondary | ICD-10-CM | POA: Diagnosis not present

## 2013-10-25 ENCOUNTER — Encounter: Payer: Self-pay | Admitting: Endocrinology

## 2013-10-25 ENCOUNTER — Ambulatory Visit (INDEPENDENT_AMBULATORY_CARE_PROVIDER_SITE_OTHER): Payer: Medicare Other | Admitting: Endocrinology

## 2013-10-25 NOTE — Patient Instructions (Addendum)
blood tests are being requested for you today.  We'll contact you with results. Please minimize the furosemide, as you do not need it for your blood pressure.   Please come back for a follow-up appointment in 6 months.

## 2013-10-25 NOTE — Progress Notes (Signed)
Subjective:    Patient ID: Denise Hebert, female    DOB: 05-31-1945, 68 y.o.   MRN: [id number]  HPI Pt returns for f/u of hypercalcemia (first found in 2013; she has never had urolithiasis, PUD, pancreatitis, depression, or bony fracture; she does not take vitamin-D or A supplements; she stopped caltrate-D last year; she stopped ergocalciferol in mid-2015; she has no h/o sarcoidosis, cancer, prolonged immobilization, hyperthyroidism, rhabdomyolysis, or osteoporosis; she does not take antacids or Li++); PTH was low).   She has slight intermittent edema of the legs, but no assoc sob. Past Medical History  Diagnosis Date  . Basal cell carcinoma   . IBS (irritable bowel syndrome)   . Back pain   . Hyperlipidemia   . Squamous cell skin cancer   . Colon polyp     Hyperplastic cecal polyps  . Palpitations   . Atrial tachycardia   . SVT (supraventricular tachycardia)   . Allergic rhinitis, cause unspecified   . HTN (hypertension)   . Calcium increased serum   . Colon polyp, hyperplastic     Cecal    Past Surgical History  Procedure Laterality Date  . Appendectomy  1977  . Cholecystectomy  1977  . Endometrial biopsy  11/2002    History   Social History  . Marital Status: Married    Spouse Name: N/A    Number of Children: N/A  . Years of Education: N/A   Occupational History  . Not on file.   Social History Main Topics  . Smoking status: Never Smoker   . Smokeless tobacco: Not on file  . Alcohol Use: Yes  . Drug Use: No  . Sexual Activity: Not on file   Other Topics Concern  . Not on file   Social History Narrative  . No narrative on file    Current Outpatient Prescriptions on File Prior to Visit  Medication Sig Dispense Refill  . acetaminophen (TYLENOL) 325 MG tablet Take 650 mg by mouth every 6 (six) hours as needed for mild pain.       Marland Kitchen aspirin 81 MG tablet Take 81 mg by mouth daily.      Marland Kitchen diltiazem (CARDIZEM CD) 120 MG 24 hr capsule Take 1 capsule (120  mg total) by mouth daily.  90 capsule  3  . furosemide (LASIX) 20 MG tablet Take 1 tablet (20 mg total) by mouth every other day.  90 tablet  3  . ibuprofen (ADVIL,MOTRIN) 200 MG tablet Take 400 mg by mouth every 6 (six) hours as needed for moderate pain.      . Multiple Vitamin (MULTIVITAMIN) tablet Take 1 tablet by mouth daily.      . naproxen sodium (ANAPROX) 220 MG tablet Take 220 mg by mouth daily.      . Omega-3 Fatty Acids (FISH OIL) 1000 MG CAPS Take 1 capsule by mouth.      . sodium chloride (OCEAN) 0.65 % SOLN nasal spray Place 1 spray into both nostrils as needed for congestion.      . valsartan (DIOVAN) 160 MG tablet Take 160 mg by mouth daily.       No current facility-administered medications on file prior to visit.    Allergies  Allergen Reactions  . Codeine     GI intolerance   . Penicillins Hives    Family History  Problem Relation Age of Onset  . Hypertension Mother   . Hypertension Father   . Congestive Heart Failure Father   .  Colon cancer Father   . Diabetes Mellitus II Father   . Alzheimer's disease Mother     Feb. 2011  . Osteoarthritis Sister     BP 122/76  Pulse 69  Temp(Src) 98.1 F (36.7 C) (Oral)  Ht 5\' 7"  (1.702 m)  Wt 194 lb (87.998 kg)  BMI 30.38 kg/m2  SpO2 97%   Review of Systems Denies numbness, but she has constipation.      Objective:   Physical Exam VITAL SIGNS:  See vs page GENERAL: no distress Ext: no edema      Assessment & Plan:  Edema, new, despite lasix, uncertain etiology. Hypercalcemia: caused or exac by HCTZ. HTN: well-controlled, despite the change from HCTZ to lasix.    Patient is advised the following: Patient Instructions  blood tests are being requested for you today.  We'll contact you with results. Please minimize the furosemide, as you do not need it for your blood pressure.   Please come back for a follow-up appointment in 6 months.

## 2013-10-27 LAB — BASIC METABOLIC PANEL
BUN: 15 mg/dL (ref 6–23)
CO2: 26 mEq/L (ref 19–32)
CREATININE: 0.8 mg/dL (ref 0.4–1.2)
Calcium: 10.4 mg/dL (ref 8.4–10.5)
Chloride: 104 mEq/L (ref 96–112)
GFR: 82.19 mL/min (ref >60.00)
Glucose, Bld: 76 mg/dL (ref 70–99)
POTASSIUM: 4.5 meq/L (ref 3.5–5.1)
Sodium: 140 mEq/L (ref 135–145)

## 2013-10-27 LAB — VITAMIN D 25 HYDROXY (VIT D DEFICIENCY, FRACTURES): VITD: 44.26 ng/mL (ref 30.00–100.00)

## 2013-10-28 LAB — PTH, INTACT AND CALCIUM

## 2013-11-02 ENCOUNTER — Other Ambulatory Visit: Payer: Self-pay

## 2013-11-02 ENCOUNTER — Other Ambulatory Visit: Payer: Medicare Other

## 2013-11-03 ENCOUNTER — Other Ambulatory Visit: Payer: Self-pay

## 2013-11-03 ENCOUNTER — Other Ambulatory Visit: Payer: Medicare Other

## 2013-11-04 LAB — PTH, INTACT AND CALCIUM
Calcium: 9.8 mg/dL (ref 8.4–10.5)
PTH: 33 pg/mL (ref 14–64)

## 2013-12-28 DIAGNOSIS — I471 Supraventricular tachycardia: Secondary | ICD-10-CM | POA: Diagnosis not present

## 2013-12-28 DIAGNOSIS — M25561 Pain in right knee: Secondary | ICD-10-CM | POA: Diagnosis not present

## 2013-12-28 DIAGNOSIS — Z13220 Encounter for screening for lipoid disorders: Secondary | ICD-10-CM | POA: Diagnosis not present

## 2013-12-28 DIAGNOSIS — Z79899 Other long term (current) drug therapy: Secondary | ICD-10-CM | POA: Diagnosis not present

## 2013-12-28 DIAGNOSIS — K5900 Constipation, unspecified: Secondary | ICD-10-CM | POA: Diagnosis not present

## 2013-12-28 DIAGNOSIS — I10 Essential (primary) hypertension: Secondary | ICD-10-CM | POA: Diagnosis not present

## 2014-03-09 DIAGNOSIS — M25562 Pain in left knee: Secondary | ICD-10-CM | POA: Diagnosis not present

## 2014-03-11 ENCOUNTER — Ambulatory Visit (INDEPENDENT_AMBULATORY_CARE_PROVIDER_SITE_OTHER): Payer: Medicare Other | Admitting: Internal Medicine

## 2014-03-11 ENCOUNTER — Encounter: Payer: Self-pay | Admitting: Internal Medicine

## 2014-03-11 VITALS — BP 138/76 | HR 63 | Ht 67.0 in | Wt 194.0 lb

## 2014-03-11 DIAGNOSIS — I471 Supraventricular tachycardia: Secondary | ICD-10-CM | POA: Diagnosis not present

## 2014-03-11 DIAGNOSIS — I10 Essential (primary) hypertension: Secondary | ICD-10-CM | POA: Diagnosis not present

## 2014-03-11 NOTE — Patient Instructions (Signed)
Your physician wants you to follow-up in: 12 months Dr Knox Saliva will receive a reminder letter in the mail two months in advance. If you don't receive a letter, please call our office to schedule the follow-up appointment.

## 2014-03-11 NOTE — Progress Notes (Signed)
PCP: Denise Blackbird, MD Primary Cardiologist:  Denise Hebert is a 69 y.o. female who presents today for routine electrophysiology followup.  Since last being seen in our clinic, the patient reports doing very well.  She is unaware of any SVT since her last visit.  She is tolerating diltiazem without difficulty.  She does have occasional constipation.   Today, she denies symptoms of palpitations, chest pain, shortness of breath,  lower extremity edema, dizziness, presyncope, or syncope.  The patient is otherwise without complaint today.   Past Medical History  Diagnosis Date  . Basal cell carcinoma   . IBS (irritable bowel syndrome)   . Back pain   . Hyperlipidemia   . Squamous cell skin cancer   . Colon polyp     Hyperplastic cecal polyps  . Palpitations   . Atrial tachycardia   . SVT (supraventricular tachycardia)   . Allergic rhinitis, cause unspecified   . HTN (hypertension)   . Calcium increased serum   . Colon polyp, hyperplastic     Cecal   Past Surgical History  Procedure Laterality Date  . Appendectomy  1977  . Cholecystectomy  1977  . Endometrial biopsy  11/2002    ROS- all systems are reviewed and negatives except as per HPI above  Current Outpatient Prescriptions  Medication Sig Dispense Refill  . acetaminophen (TYLENOL) 325 MG tablet Take 650 mg by mouth every 6 (six) hours as needed for mild pain.     Marland Kitchen aspirin 81 MG tablet Take 81 mg by mouth daily.    Marland Kitchen diltiazem (CARDIZEM CD) 120 MG 24 hr capsule Take 1 capsule (120 mg total) by mouth daily. 90 capsule 3  . furosemide (LASIX) 20 MG tablet Take 1 tablet (20 mg total) by mouth every other day. 90 tablet 3  . ibuprofen (ADVIL,MOTRIN) 200 MG tablet Take 400 mg by mouth every 6 (six) hours as needed for moderate pain.    . Multiple Vitamin (MULTIVITAMIN) tablet Take 1 tablet by mouth daily.    . naproxen sodium (ANAPROX) 220 MG tablet Take 220 mg by mouth daily.    . Omega-3 Fatty Acids (FISH OIL)  1000 MG CAPS Take 1 capsule by mouth.    . sodium chloride (OCEAN) 0.65 % SOLN nasal spray Place 1 spray into both nostrils as needed for congestion.    . valsartan (DIOVAN) 160 MG tablet Take 160 mg by mouth daily.     No current facility-administered medications for this visit.    Physical Exam: Filed Vitals:   03/11/14 1542  BP: 138/76  Pulse: 63  Height: 5\' 7"  (1.702 m)  Weight: 194 lb (87.998 kg)    GEN- The patient is well appearing, alert and oriented x 3 today.   Head- normocephalic, atraumatic Eyes-  Sclera clear, conjunctiva pink Ears- hearing intact Oropharynx- clear Lungs- Clear to ausculation bilaterally, normal work of breathing Heart- Regular rate and rhythm, no murmurs, rubs or gallops, PMI not laterally displaced GI- soft, NT, ND, + BS Extremities- no clubbing, cyanosis, or edema   Assessment and Plan:  1. Svt Well controlled No further workup planned She is not interested in ablation presently  2. htn Controlled   Return in 12 months

## 2014-03-14 DIAGNOSIS — M25562 Pain in left knee: Secondary | ICD-10-CM | POA: Diagnosis not present

## 2014-03-14 DIAGNOSIS — M7632 Iliotibial band syndrome, left leg: Secondary | ICD-10-CM | POA: Diagnosis not present

## 2014-03-17 DIAGNOSIS — M7632 Iliotibial band syndrome, left leg: Secondary | ICD-10-CM | POA: Diagnosis not present

## 2014-03-17 DIAGNOSIS — M25562 Pain in left knee: Secondary | ICD-10-CM | POA: Diagnosis not present

## 2014-03-23 DIAGNOSIS — M25562 Pain in left knee: Secondary | ICD-10-CM | POA: Diagnosis not present

## 2014-03-23 DIAGNOSIS — M7632 Iliotibial band syndrome, left leg: Secondary | ICD-10-CM | POA: Diagnosis not present

## 2014-04-27 ENCOUNTER — Ambulatory Visit: Payer: Medicare Other | Admitting: Endocrinology

## 2014-05-16 ENCOUNTER — Ambulatory Visit (INDEPENDENT_AMBULATORY_CARE_PROVIDER_SITE_OTHER): Payer: Medicare Other | Admitting: Endocrinology

## 2014-05-16 ENCOUNTER — Encounter: Payer: Self-pay | Admitting: Endocrinology

## 2014-05-16 NOTE — Progress Notes (Signed)
Subjective:    Patient ID: Denise Hebert, female    DOB: 09-15-1945, 69 y.o.   MRN: [id number]  HPI Pt returns for f/u of hypercalcemia (first found in 2013; she has never had urolithiasis, PUD, pancreatitis, depression, or bony fracture; she does not take vitamin-A supplements; she stopped ergocalciferol in mid-2015; she has no h/o sarcoidosis, cancer, prolonged immobilization, hyperthyroidism, rhabdomyolysis, or osteoporosis; she does not take antacids or Li++; PTH was low).  She takes lasix just as needed for edema.  Her last dose was a few weeks ago.   Past Medical History  Diagnosis Date  . Basal cell carcinoma   . IBS (irritable bowel syndrome)   . Back pain   . Hyperlipidemia   . Squamous cell skin cancer   . Colon polyp     Hyperplastic cecal polyps  . Palpitations   . Atrial tachycardia   . SVT (supraventricular tachycardia)   . Allergic rhinitis, cause unspecified   . HTN (hypertension)   . Calcium increased serum   . Colon polyp, hyperplastic     Cecal    Past Surgical History  Procedure Laterality Date  . Appendectomy  1977  . Cholecystectomy  1977  . Endometrial biopsy  11/2002    History   Social History  . Marital Status: Married    Spouse Name: N/A  . Number of Children: N/A  . Years of Education: N/A   Occupational History  . Not on file.   Social History Main Topics  . Smoking status: Never Smoker   . Smokeless tobacco: Not on file  . Alcohol Use: Yes  . Drug Use: No  . Sexual Activity: Not on file   Other Topics Concern  . Not on file   Social History Narrative    Current Outpatient Prescriptions on File Prior to Visit  Medication Sig Dispense Refill  . acetaminophen (TYLENOL) 325 MG tablet Take 650 mg by mouth every 6 (six) hours as needed for mild pain.     Marland Kitchen aspirin 81 MG tablet Take 81 mg by mouth daily.    Marland Kitchen diltiazem (CARDIZEM CD) 120 MG 24 hr capsule Take 1 capsule (120 mg total) by mouth daily. 90 capsule 3  . furosemide  (LASIX) 20 MG tablet Take 1 tablet (20 mg total) by mouth every other day. (Patient not taking: Reported on 05/16/2014) 90 tablet 3  . ibuprofen (ADVIL,MOTRIN) 200 MG tablet Take 400 mg by mouth every 6 (six) hours as needed for moderate pain.    . Multiple Vitamin (MULTIVITAMIN) tablet Take 1 tablet by mouth daily.    . Omega-3 Fatty Acids (FISH OIL) 1000 MG CAPS Take 1 capsule by mouth.    . sodium chloride (OCEAN) 0.65 % SOLN nasal spray Place 1 spray into both nostrils as needed for congestion.    . valsartan (DIOVAN) 160 MG tablet Take 160 mg by mouth daily.     No current facility-administered medications on file prior to visit.    Allergies  Allergen Reactions  . Codeine     GI intolerance   . Penicillins Hives    Family History  Problem Relation Age of Onset  . Hypertension Mother   . Hypertension Father   . Congestive Heart Failure Father   . Colon cancer Father   . Diabetes Mellitus II Father   . Alzheimer's disease Mother     Feb. 2011  . Osteoarthritis Sister     BP 138/84 mmHg  Pulse 72  Temp(Src)  98.2 F (36.8 C) (Oral)  Wt 195 lb (88.451 kg)  SpO2 95%  Review of Systems Denies numbness.      Objective:   Physical Exam VITAL SIGNS:  See vs page GENERAL: no distress Ext: no edema.       Assessment & Plan:  Hypercalcemia: caused or exac by HCTZ.  It was most recently normal off it.  However, she is at risk for recurrence.    Patient is advised the following: Patient Instructions  blood tests are being requested for you today.  We'll let you know about the results. If it is good today, you only need to come back here as needed. Dr Chapman Fitch will check you calcium level checked each year. Please come back here if it is high.

## 2014-05-16 NOTE — Patient Instructions (Signed)
blood tests are being requested for you today.  We'll let you know about the results. If it is good today, you only need to come back here as needed. Dr Chapman Fitch will check you calcium level checked each year. Please come back here if it is high.

## 2014-05-17 LAB — PTH, INTACT AND CALCIUM
Calcium: 10 mg/dL (ref 8.4–10.5)
PTH: 20 pg/mL (ref 14–64)

## 2014-06-01 ENCOUNTER — Other Ambulatory Visit: Payer: Self-pay | Admitting: Internal Medicine

## 2014-10-12 ENCOUNTER — Other Ambulatory Visit: Payer: Self-pay | Admitting: Family Medicine

## 2014-10-12 ENCOUNTER — Other Ambulatory Visit (HOSPITAL_COMMUNITY)
Admission: RE | Admit: 2014-10-12 | Discharge: 2014-10-12 | Disposition: A | Discharge status: Home / Self Care | Payer: Medicare Other | Source: Ambulatory Visit | Attending: Family Medicine | Admitting: Family Medicine

## 2014-10-12 DIAGNOSIS — Z124 Encounter for screening for malignant neoplasm of cervix: Secondary | ICD-10-CM | POA: Insufficient documentation

## 2014-10-12 DIAGNOSIS — E785 Hyperlipidemia, unspecified: Secondary | ICD-10-CM | POA: Diagnosis not present

## 2014-10-12 DIAGNOSIS — K589 Irritable bowel syndrome without diarrhea: Secondary | ICD-10-CM | POA: Diagnosis not present

## 2014-10-12 DIAGNOSIS — I471 Supraventricular tachycardia: Secondary | ICD-10-CM | POA: Diagnosis not present

## 2014-10-12 DIAGNOSIS — Z23 Encounter for immunization: Secondary | ICD-10-CM | POA: Diagnosis not present

## 2014-10-12 DIAGNOSIS — I10 Essential (primary) hypertension: Secondary | ICD-10-CM | POA: Diagnosis not present

## 2014-10-12 DIAGNOSIS — Z79899 Other long term (current) drug therapy: Secondary | ICD-10-CM | POA: Diagnosis not present

## 2014-10-12 DIAGNOSIS — J309 Allergic rhinitis, unspecified: Secondary | ICD-10-CM | POA: Diagnosis not present

## 2014-10-13 LAB — CYTOLOGY - PAP

## 2014-10-28 DIAGNOSIS — Z780 Asymptomatic menopausal state: Secondary | ICD-10-CM | POA: Diagnosis not present

## 2014-10-28 DIAGNOSIS — Z1231 Encounter for screening mammogram for malignant neoplasm of breast: Secondary | ICD-10-CM | POA: Diagnosis not present

## 2014-11-08 DIAGNOSIS — L720 Epidermal cyst: Secondary | ICD-10-CM | POA: Diagnosis not present

## 2014-11-08 DIAGNOSIS — L821 Other seborrheic keratosis: Secondary | ICD-10-CM | POA: Diagnosis not present

## 2014-11-08 DIAGNOSIS — D225 Melanocytic nevi of trunk: Secondary | ICD-10-CM | POA: Diagnosis not present

## 2014-11-08 DIAGNOSIS — D485 Neoplasm of uncertain behavior of skin: Secondary | ICD-10-CM | POA: Diagnosis not present

## 2014-11-08 DIAGNOSIS — D2272 Melanocytic nevi of left lower limb, including hip: Secondary | ICD-10-CM | POA: Diagnosis not present

## 2014-11-08 DIAGNOSIS — Z85828 Personal history of other malignant neoplasm of skin: Secondary | ICD-10-CM | POA: Diagnosis not present

## 2014-11-08 DIAGNOSIS — L814 Other melanin hyperpigmentation: Secondary | ICD-10-CM | POA: Diagnosis not present

## 2014-11-08 DIAGNOSIS — D2262 Melanocytic nevi of left upper limb, including shoulder: Secondary | ICD-10-CM | POA: Diagnosis not present

## 2014-11-08 DIAGNOSIS — L570 Actinic keratosis: Secondary | ICD-10-CM | POA: Diagnosis not present

## 2015-01-05 DIAGNOSIS — Z23 Encounter for immunization: Secondary | ICD-10-CM | POA: Diagnosis not present

## 2015-04-05 ENCOUNTER — Encounter: Payer: Self-pay | Admitting: Internal Medicine

## 2015-04-05 ENCOUNTER — Encounter (INDEPENDENT_AMBULATORY_CARE_PROVIDER_SITE_OTHER): Payer: Self-pay

## 2015-04-05 ENCOUNTER — Ambulatory Visit (INDEPENDENT_AMBULATORY_CARE_PROVIDER_SITE_OTHER): Payer: Medicare Other | Admitting: Internal Medicine

## 2015-04-05 VITALS — BP 164/98 | HR 84 | Ht 66.5 in | Wt 196.0 lb

## 2015-04-05 DIAGNOSIS — I471 Supraventricular tachycardia: Secondary | ICD-10-CM | POA: Diagnosis not present

## 2015-04-05 DIAGNOSIS — I10 Essential (primary) hypertension: Secondary | ICD-10-CM | POA: Diagnosis not present

## 2015-04-05 MED ORDER — DILTIAZEM HCL ER 120 MG PO CP24: 120.0000 mg | ORAL_CAPSULE | Freq: Every day | ORAL | Status: DC

## 2015-04-05 NOTE — Patient Instructions (Signed)
Medication Instructions:  Your physician recommends that you continue on your current medications as directed. Please refer to the Current Medication list given to you today.  Labwork: None ordered  Testing/Procedures: None ordered  Follow-Up: Your physician wants you to follow-up in: 1 year with Dr. Rayann Heman. You will receive a reminder letter in the mail two months in advance. If you don't receive a letter, please call our office to schedule the follow-up appointment.  If you need a refill on your cardiac medications before your next appointment, please call your pharmacy.  Thank you for choosing [facility]!!

## 2015-04-05 NOTE — Progress Notes (Signed)
PCP: Antony Blackbird, MD Primary Cardiologist:  Dr Prudence Davidson is a 70 y.o. female who presents today for routine electrophysiology followup.  Since last being seen in our clinic, the patient reports doing very well.  She is unaware of any SVT since her last visit.  She is tolerating diltiazem without difficulty.   Today, she denies symptoms of palpitations, chest pain, shortness of breath,  lower extremity edema, dizziness, presyncope, or syncope.  The patient is otherwise without complaint today.   Past Medical History  Diagnosis Date  . Basal cell carcinoma   . IBS (irritable bowel syndrome)   . Back pain   . Hyperlipidemia   . Squamous cell skin cancer   . Colon polyp     Hyperplastic cecal polyps  . Palpitations   . Atrial tachycardia ([place])   . SVT (supraventricular tachycardia) ([place])   . Allergic rhinitis, cause unspecified   . HTN (hypertension)   . Calcium increased serum   . Colon polyp, hyperplastic     Cecal   Past Surgical History  Procedure Laterality Date  . Appendectomy  1977  . Cholecystectomy  1977  . Endometrial biopsy  11/2002    ROS- all systems are reviewed and negatives except as per HPI above  Current Outpatient Prescriptions  Medication Sig Dispense Refill  . acetaminophen (TYLENOL) 325 MG tablet Take 650 mg by mouth every 6 (six) hours as needed for mild pain.     Marland Kitchen aspirin 81 MG tablet Take 81 mg by mouth daily.    Marland Kitchen diltiazem (DILACOR XR) 120 MG 24 hr capsule Take 1 capsule (120 mg total) by mouth daily. 90 capsule 3  . ibuprofen (ADVIL,MOTRIN) 200 MG tablet Take 400 mg by mouth every 6 (six) hours as needed for moderate pain.    . Multiple Vitamin (MULTIVITAMIN) tablet Take 1 tablet by mouth daily.    . Omega-3 Fatty Acids (FISH OIL) 1000 MG CAPS Take 1 capsule by mouth.    . sodium chloride (OCEAN) 0.65 % SOLN nasal spray Place 1 spray into both nostrils as needed for congestion.    . valsartan (DIOVAN) 160 MG tablet Take 160 mg by  mouth daily.     No current facility-administered medications for this visit.    Physical Exam: Filed Vitals:   04/05/15 1541  BP: 164/98  Pulse: 84  Height: 5' 6.5" (1.689 m)  Weight: 196 lb (88.905 kg)    GEN- The patient is well appearing, alert and oriented x 3 today.   Head- normocephalic, atraumatic Eyes-  Sclera clear, conjunctiva pink Ears- hearing intact Oropharynx- clear Lungs- Clear to ausculation bilaterally, normal work of breathing Heart- Regular rate and rhythm, no murmurs, rubs or gallops, PMI not laterally displaced GI- soft, NT, ND, + BS Extremities- no clubbing, cyanosis, or edema  ekg today reveals sinus rhythm 84 bpm, PR 170 msec, RBBB  Assessment and Plan:  1. Svt Well controlled No further workup planned She is not interested in ablation presently  2. htn Elevated today though she reports good BP control at home Reluctant to make changes Lifestyle modification discussed at length  3. Overweight Body mass index is 31.16 kg/(m^2). Weight reduction encouraged   Return in 12 months  Thompson Grayer MD, [facility] 04/05/2015 4:02 PM

## 2015-04-13 DIAGNOSIS — M8580 Other specified disorders of bone density and structure, unspecified site: Secondary | ICD-10-CM | POA: Diagnosis not present

## 2015-04-13 DIAGNOSIS — I471 Supraventricular tachycardia: Secondary | ICD-10-CM | POA: Diagnosis not present

## 2015-04-13 DIAGNOSIS — Z79899 Other long term (current) drug therapy: Secondary | ICD-10-CM | POA: Diagnosis not present

## 2015-04-13 DIAGNOSIS — I10 Essential (primary) hypertension: Secondary | ICD-10-CM | POA: Diagnosis not present

## 2015-04-13 DIAGNOSIS — E785 Hyperlipidemia, unspecified: Secondary | ICD-10-CM | POA: Diagnosis not present

## 2015-04-13 DIAGNOSIS — K5900 Constipation, unspecified: Secondary | ICD-10-CM | POA: Diagnosis not present

## 2015-05-19 ENCOUNTER — Other Ambulatory Visit: Payer: Self-pay | Admitting: Internal Medicine

## 2015-08-22 DIAGNOSIS — H2513 Age-related nuclear cataract, bilateral: Secondary | ICD-10-CM | POA: Diagnosis not present

## 2015-08-30 DIAGNOSIS — M25512 Pain in left shoulder: Secondary | ICD-10-CM | POA: Diagnosis not present

## 2015-10-11 DIAGNOSIS — M25512 Pain in left shoulder: Secondary | ICD-10-CM | POA: Diagnosis not present

## 2015-10-12 DIAGNOSIS — Z1231 Encounter for screening mammogram for malignant neoplasm of breast: Secondary | ICD-10-CM | POA: Diagnosis not present

## 2015-10-12 DIAGNOSIS — I10 Essential (primary) hypertension: Secondary | ICD-10-CM | POA: Diagnosis not present

## 2015-10-12 DIAGNOSIS — E785 Hyperlipidemia, unspecified: Secondary | ICD-10-CM | POA: Diagnosis not present

## 2015-10-18 DIAGNOSIS — M25512 Pain in left shoulder: Secondary | ICD-10-CM | POA: Diagnosis not present

## 2015-10-25 DIAGNOSIS — M25512 Pain in left shoulder: Secondary | ICD-10-CM | POA: Diagnosis not present

## 2015-11-02 DIAGNOSIS — M25512 Pain in left shoulder: Secondary | ICD-10-CM | POA: Diagnosis not present

## 2015-11-02 DIAGNOSIS — M7552 Bursitis of left shoulder: Secondary | ICD-10-CM | POA: Diagnosis not present

## 2015-11-02 DIAGNOSIS — M7542 Impingement syndrome of left shoulder: Secondary | ICD-10-CM | POA: Diagnosis not present

## 2015-11-08 DIAGNOSIS — Z1231 Encounter for screening mammogram for malignant neoplasm of breast: Secondary | ICD-10-CM | POA: Diagnosis not present

## 2015-11-08 DIAGNOSIS — Z803 Family history of malignant neoplasm of breast: Secondary | ICD-10-CM | POA: Diagnosis not present

## 2015-11-16 DIAGNOSIS — M25512 Pain in left shoulder: Secondary | ICD-10-CM | POA: Diagnosis not present

## 2015-11-16 DIAGNOSIS — M7542 Impingement syndrome of left shoulder: Secondary | ICD-10-CM | POA: Diagnosis not present

## 2015-11-16 DIAGNOSIS — M7552 Bursitis of left shoulder: Secondary | ICD-10-CM | POA: Diagnosis not present

## 2015-12-11 DIAGNOSIS — M25512 Pain in left shoulder: Secondary | ICD-10-CM | POA: Diagnosis not present

## 2015-12-13 DIAGNOSIS — Z23 Encounter for immunization: Secondary | ICD-10-CM | POA: Diagnosis not present

## 2016-01-03 DIAGNOSIS — L821 Other seborrheic keratosis: Secondary | ICD-10-CM | POA: Diagnosis not present

## 2016-01-03 DIAGNOSIS — Z85828 Personal history of other malignant neoplasm of skin: Secondary | ICD-10-CM | POA: Diagnosis not present

## 2016-01-03 DIAGNOSIS — L570 Actinic keratosis: Secondary | ICD-10-CM | POA: Diagnosis not present

## 2016-01-03 DIAGNOSIS — D045 Carcinoma in situ of skin of trunk: Secondary | ICD-10-CM | POA: Diagnosis not present

## 2016-01-09 DIAGNOSIS — Z85828 Personal history of other malignant neoplasm of skin: Secondary | ICD-10-CM | POA: Diagnosis not present

## 2016-01-09 DIAGNOSIS — D045 Carcinoma in situ of skin of trunk: Secondary | ICD-10-CM | POA: Diagnosis not present

## 2016-01-16 IMAGING — CR DG CHEST 2V
2 series · 2 of 2 positions shown · non-contrast
Comparison: None.

CLINICAL DATA: Tachycardia.

EXAM:
CHEST  2 VIEW

[w chest pa]
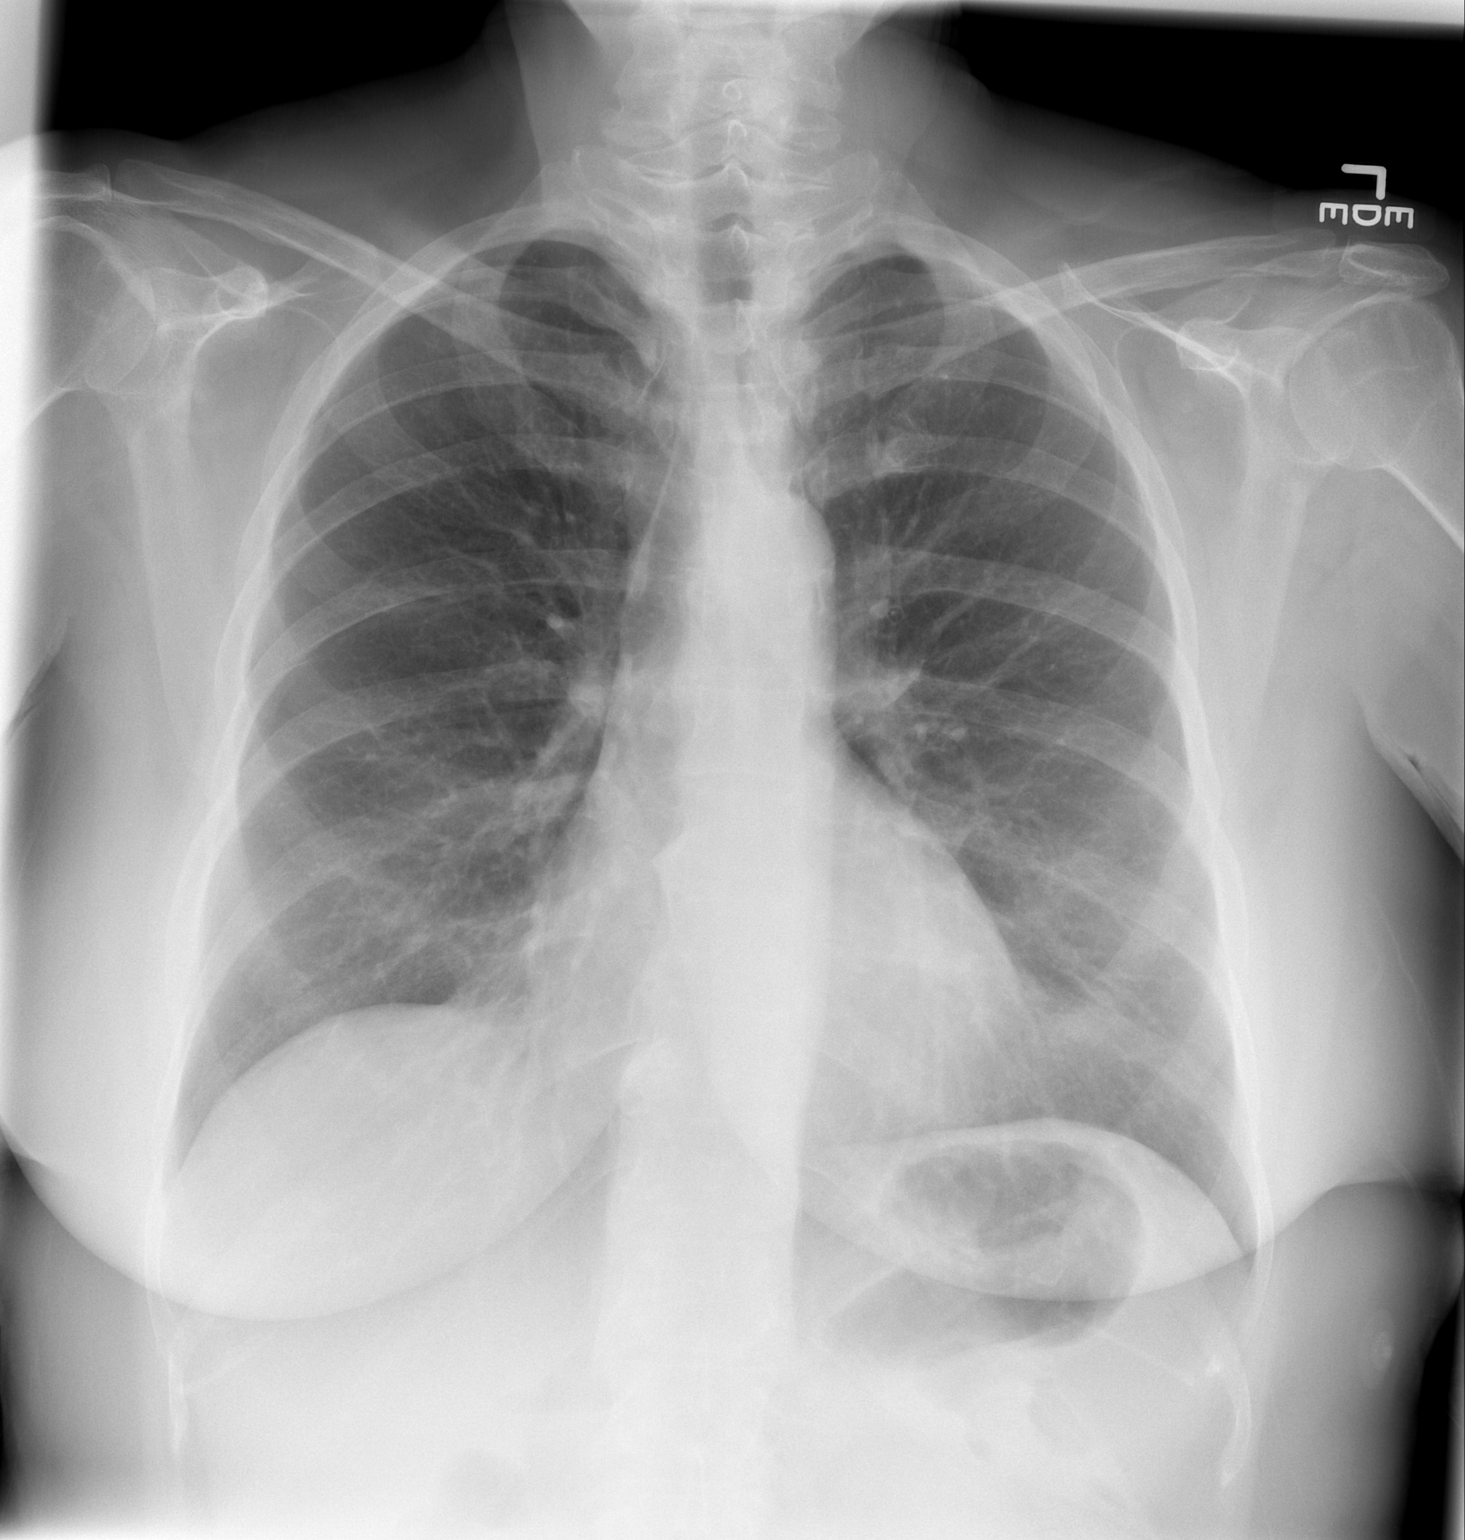

[w chest lat]
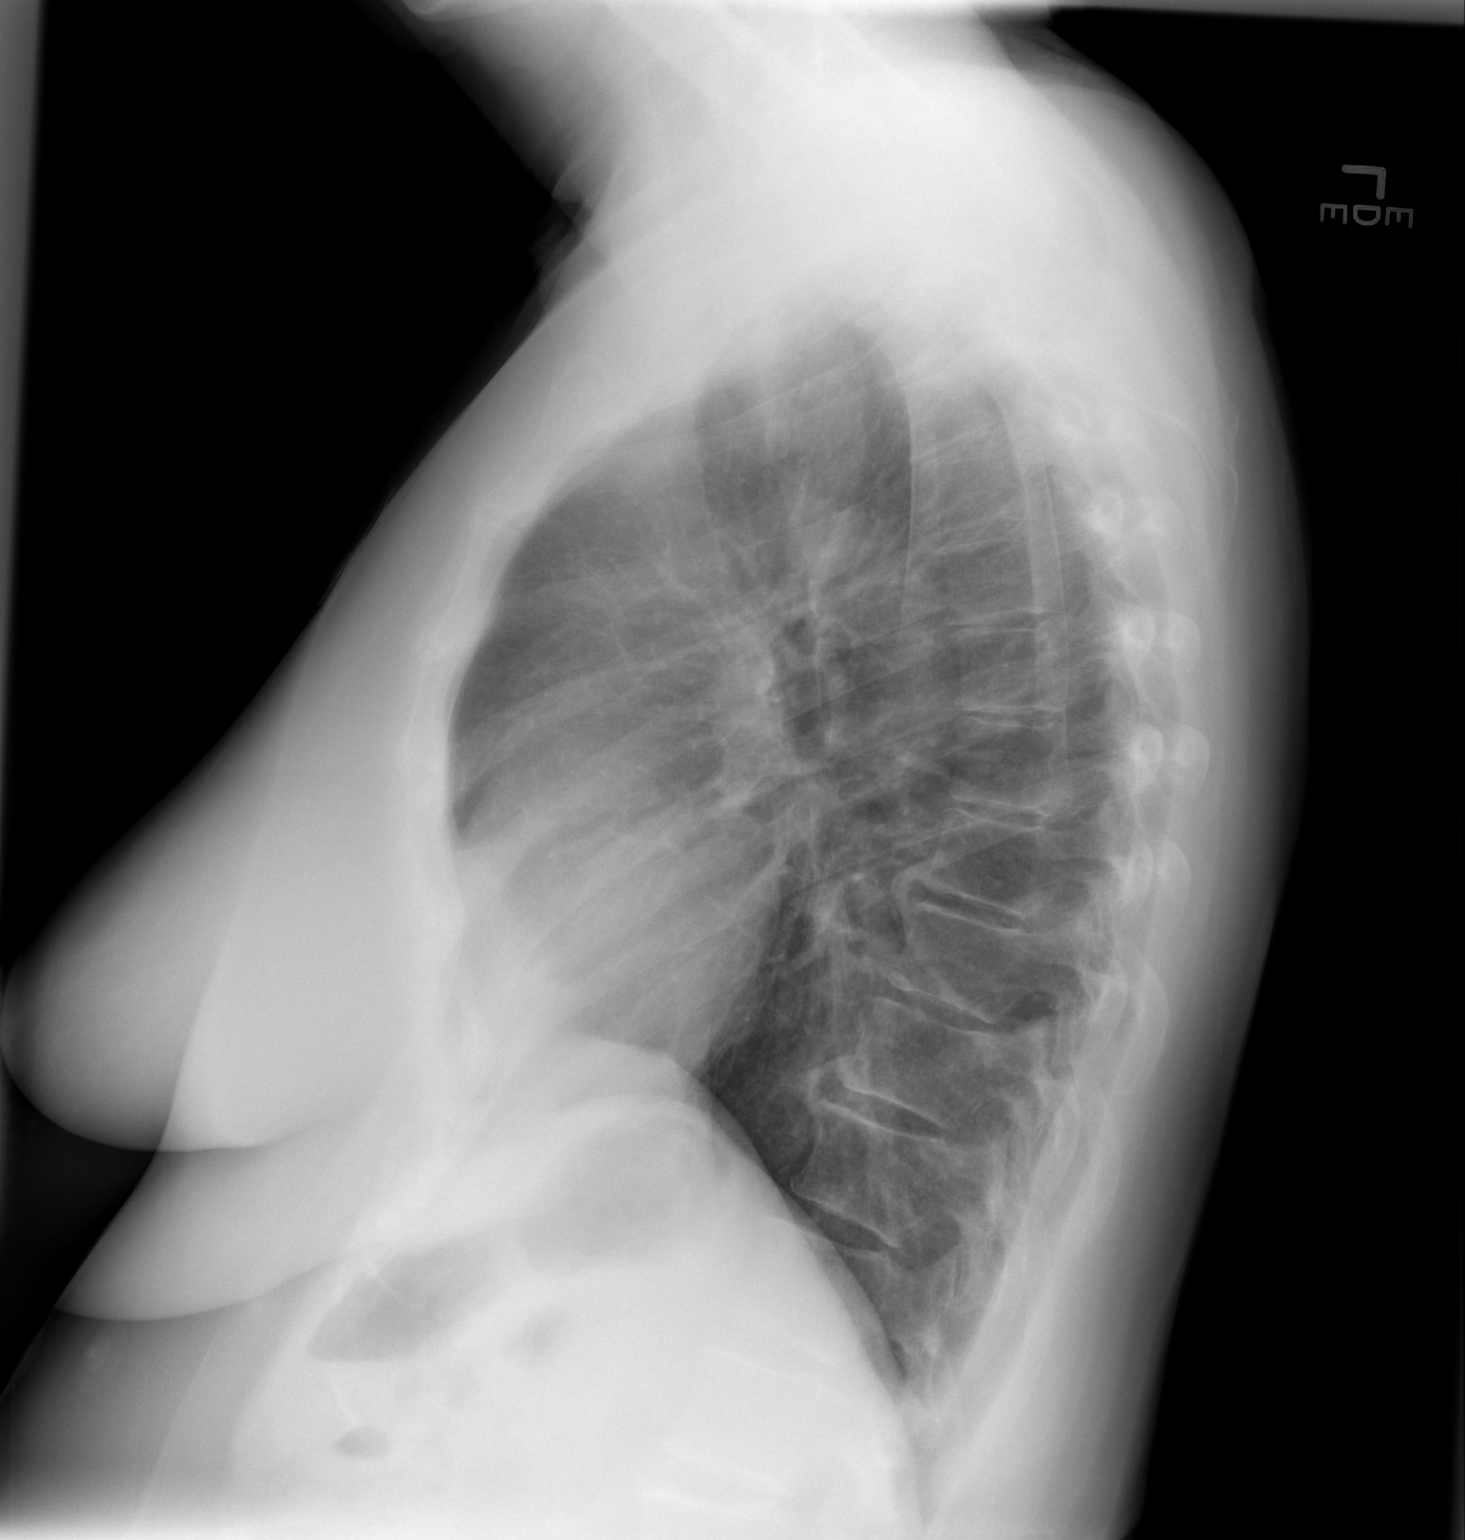

[2 of 2 positions shown; findings below may reference images not displayed]

FINDINGS: The cardiomediastinal silhouette is within normal limits. The lungs
are well inflated and clear. There is no evidence of pleural
effusion or pneumothorax. No acute osseous abnormality is
identified.
IMPRESSION: No active cardiopulmonary disease.

## 2016-02-24 DIAGNOSIS — M273 Alveolitis of jaws: Secondary | ICD-10-CM | POA: Diagnosis not present

## 2016-02-24 DIAGNOSIS — K0520 Aggressive periodontitis, unspecified: Secondary | ICD-10-CM | POA: Diagnosis not present

## 2016-04-10 ENCOUNTER — Ambulatory Visit (INDEPENDENT_AMBULATORY_CARE_PROVIDER_SITE_OTHER): Payer: Medicare Other | Admitting: Internal Medicine

## 2016-04-10 ENCOUNTER — Encounter: Payer: Self-pay | Admitting: Internal Medicine

## 2016-04-10 VITALS — BP 148/84 | HR 76 | Ht 66.0 in | Wt 197.6 lb

## 2016-04-10 DIAGNOSIS — I10 Essential (primary) hypertension: Secondary | ICD-10-CM

## 2016-04-10 DIAGNOSIS — I471 Supraventricular tachycardia: Secondary | ICD-10-CM | POA: Diagnosis not present

## 2016-04-10 MED ORDER — DILTIAZEM HCL ER 120 MG PO CP24: 120.0000 mg | ORAL_CAPSULE | Freq: Every day | ORAL | 3 refills | Status: DC

## 2016-04-10 NOTE — Patient Instructions (Signed)

## 2016-04-10 NOTE — Progress Notes (Signed)
PCP: Antony Blackbird, MD Primary Cardiologist:  Dr Prudence Davidson is a 71 y.o. female who presents today for routine electrophysiology followup.  Since last being seen in our clinic, the patient reports doing very well.  She is unaware of any SVT in over 2 years.  She is tolerating diltiazem without difficulty.   Today, she denies symptoms of palpitations, chest pain, shortness of breath,  lower extremity edema, dizziness, presyncope, or syncope.  The patient is otherwise without complaint today.   Past Medical History:  Diagnosis Date  . Allergic rhinitis, cause unspecified   . Atrial tachycardia ([place])   . Back pain   . Basal cell carcinoma   . Calcium increased serum   . Colon polyp    Hyperplastic cecal polyps  . Colon polyp, hyperplastic    Cecal  . HTN (hypertension)   . Hyperlipidemia   . IBS (irritable bowel syndrome)   . Palpitations   . Squamous cell skin cancer   . SVT (supraventricular tachycardia) (H[facility])    Past Surgical History:  Procedure Laterality Date  . APPENDECTOMY  1977  . CHOLECYSTECTOMY  1977  . ENDOMETRIAL BIOPSY  11/2002    ROS- all systems are reviewed and negatives except as per HPI above  Current Outpatient Prescriptions  Medication Sig Dispense Refill  . acetaminophen (TYLENOL) 325 MG tablet Take 650 mg by mouth every 6 (six) hours as needed for mild pain.     Marland Kitchen aspirin 81 MG tablet Take 81 mg by mouth daily.    . Cholecalciferol (VITAMIN D3) 1000 units CAPS Take 1,000 Units by mouth daily.    Marland Kitchen diltiazem (DILACOR XR) 120 MG 24 hr capsule Take 1 capsule (120 mg total) by mouth daily. 90 capsule 3  . ibuprofen (ADVIL,MOTRIN) 200 MG tablet Take 400 mg by mouth every 6 (six) hours as needed for moderate pain.    . Multiple Vitamin (MULTIVITAMIN) tablet Take 1 tablet by mouth daily.    . Omega-3 Fatty Acids (FISH OIL) 1000 MG CAPS Take 1 capsule by mouth.    . sodium chloride (OCEAN) 0.65 % SOLN nasal spray Place 1 spray into both nostrils as  needed for congestion. Use as directed    . TURMERIC PO Take 1 capsule by mouth daily.    . valsartan (DIOVAN) 160 MG tablet Take 160 mg by mouth daily.     No current facility-administered medications for this visit.     Physical Exam: Vitals:   04/10/16 1512  BP: (!) 148/84  Pulse: 76  Weight: 197 lb 9.6 oz (89.6 kg)  Height: 5\' 6"  (1.676 m)    GEN- The patient is well appearing, alert and oriented x 3 today.   Head- normocephalic, atraumatic Eyes-  Sclera clear, conjunctiva pink Ears- hearing intact Oropharynx- clear Lungs- Clear to ausculation bilaterally, normal work of breathing Heart- Regular rate and rhythm, no murmurs, rubs or gallops, PMI not laterally displaced GI- soft, NT, ND, + BS Extremities- no clubbing, cyanosis, or edema  ekg today reveals sinus rhythm 76 bpm, PR 166 msec, RBBB/ LAHB  Assessment and Plan:  1. Svt Well controlled No further workup planned She is not interested in ablation presently  2. htn She reports good control at home Lifestyle modification discussed at length  3. Overweight Body mass index is 31.89 kg/m. Weight reduction encouraged   Return in 12 months  Thompson Grayer MD, [facility] 04/10/2016 3:39 PM

## 2016-04-29 DIAGNOSIS — M8580 Other specified disorders of bone density and structure, unspecified site: Secondary | ICD-10-CM | POA: Diagnosis not present

## 2016-04-29 DIAGNOSIS — I10 Essential (primary) hypertension: Secondary | ICD-10-CM | POA: Diagnosis not present

## 2016-04-29 DIAGNOSIS — I471 Supraventricular tachycardia: Secondary | ICD-10-CM | POA: Diagnosis not present

## 2016-05-17 ENCOUNTER — Other Ambulatory Visit: Payer: Self-pay | Admitting: Internal Medicine

## 2016-08-05 DIAGNOSIS — J0100 Acute maxillary sinusitis, unspecified: Secondary | ICD-10-CM | POA: Diagnosis not present

## 2016-08-05 DIAGNOSIS — J069 Acute upper respiratory infection, unspecified: Secondary | ICD-10-CM | POA: Diagnosis not present

## 2016-10-02 DIAGNOSIS — E669 Obesity, unspecified: Secondary | ICD-10-CM | POA: Diagnosis not present

## 2016-10-02 DIAGNOSIS — I471 Supraventricular tachycardia: Secondary | ICD-10-CM | POA: Diagnosis not present

## 2016-10-02 DIAGNOSIS — I10 Essential (primary) hypertension: Secondary | ICD-10-CM | POA: Diagnosis not present

## 2016-10-02 DIAGNOSIS — Z800 Family history of malignant neoplasm of digestive organs: Secondary | ICD-10-CM | POA: Diagnosis not present

## 2016-10-02 DIAGNOSIS — Z86010 Personal history of colonic polyps: Secondary | ICD-10-CM | POA: Diagnosis not present

## 2016-10-02 DIAGNOSIS — M85851 Other specified disorders of bone density and structure, right thigh: Secondary | ICD-10-CM | POA: Diagnosis not present

## 2016-10-02 DIAGNOSIS — Z6832 Body mass index (BMI) 32.0-32.9, adult: Secondary | ICD-10-CM | POA: Diagnosis not present

## 2016-11-15 DIAGNOSIS — K6289 Other specified diseases of anus and rectum: Secondary | ICD-10-CM | POA: Diagnosis not present

## 2016-11-15 DIAGNOSIS — Z800 Family history of malignant neoplasm of digestive organs: Secondary | ICD-10-CM | POA: Diagnosis not present

## 2016-11-15 DIAGNOSIS — K648 Other hemorrhoids: Secondary | ICD-10-CM | POA: Diagnosis not present

## 2016-11-15 DIAGNOSIS — Z86010 Personal history of colonic polyps: Secondary | ICD-10-CM | POA: Diagnosis not present

## 2016-11-15 DIAGNOSIS — K5730 Diverticulosis of large intestine without perforation or abscess without bleeding: Secondary | ICD-10-CM | POA: Diagnosis not present

## 2016-11-15 DIAGNOSIS — K635 Polyp of colon: Secondary | ICD-10-CM | POA: Diagnosis not present

## 2016-11-19 DIAGNOSIS — K635 Polyp of colon: Secondary | ICD-10-CM | POA: Diagnosis not present

## 2016-12-02 DIAGNOSIS — Z23 Encounter for immunization: Secondary | ICD-10-CM | POA: Diagnosis not present

## 2016-12-12 DIAGNOSIS — Z1231 Encounter for screening mammogram for malignant neoplasm of breast: Secondary | ICD-10-CM | POA: Diagnosis not present

## 2017-03-12 DIAGNOSIS — L814 Other melanin hyperpigmentation: Secondary | ICD-10-CM | POA: Diagnosis not present

## 2017-03-12 DIAGNOSIS — D2262 Melanocytic nevi of left upper limb, including shoulder: Secondary | ICD-10-CM | POA: Diagnosis not present

## 2017-03-12 DIAGNOSIS — L720 Epidermal cyst: Secondary | ICD-10-CM | POA: Diagnosis not present

## 2017-03-12 DIAGNOSIS — D1801 Hemangioma of skin and subcutaneous tissue: Secondary | ICD-10-CM | POA: Diagnosis not present

## 2017-03-12 DIAGNOSIS — L821 Other seborrheic keratosis: Secondary | ICD-10-CM | POA: Diagnosis not present

## 2017-03-12 DIAGNOSIS — D225 Melanocytic nevi of trunk: Secondary | ICD-10-CM | POA: Diagnosis not present

## 2017-03-12 DIAGNOSIS — Z85828 Personal history of other malignant neoplasm of skin: Secondary | ICD-10-CM | POA: Diagnosis not present

## 2017-03-12 DIAGNOSIS — L570 Actinic keratosis: Secondary | ICD-10-CM | POA: Diagnosis not present

## 2017-04-24 DIAGNOSIS — M899 Disorder of bone, unspecified: Secondary | ICD-10-CM | POA: Diagnosis not present

## 2017-04-24 DIAGNOSIS — I10 Essential (primary) hypertension: Secondary | ICD-10-CM | POA: Diagnosis not present

## 2017-04-28 DIAGNOSIS — J069 Acute upper respiratory infection, unspecified: Secondary | ICD-10-CM | POA: Diagnosis not present

## 2017-04-28 DIAGNOSIS — E7800 Pure hypercholesterolemia, unspecified: Secondary | ICD-10-CM | POA: Diagnosis not present

## 2017-04-28 DIAGNOSIS — I10 Essential (primary) hypertension: Secondary | ICD-10-CM | POA: Diagnosis not present

## 2017-04-30 ENCOUNTER — Encounter: Payer: Self-pay | Admitting: Internal Medicine

## 2017-04-30 ENCOUNTER — Ambulatory Visit (INDEPENDENT_AMBULATORY_CARE_PROVIDER_SITE_OTHER): Payer: Medicare Other | Admitting: Internal Medicine

## 2017-04-30 VITALS — BP 160/80 | HR 69 | Ht 66.0 in | Wt 200.0 lb

## 2017-04-30 DIAGNOSIS — I10 Essential (primary) hypertension: Secondary | ICD-10-CM | POA: Diagnosis not present

## 2017-04-30 DIAGNOSIS — I471 Supraventricular tachycardia: Secondary | ICD-10-CM | POA: Diagnosis not present

## 2017-04-30 NOTE — Patient Instructions (Signed)
Medication Instructions:  Your physician recommends that you continue on your current medications as directed. Please refer to the Current Medication list given to you today.   Labwork: None ordered  Testing/Procedures: None ordered  Follow-Up: Your physician wants you to follow-up in: 1 year with Dr. Rayann Heman. You will receive a reminder letter in the mail two months in advance. If you don't receive a letter, please call our office to schedule the follow-up appointment.   Any Other Special Instructions Will Be Listed Below (If Applicable).     If you need a refill on your cardiac medications before your next appointment, please call your pharmacy.

## 2017-04-30 NOTE — Progress Notes (Signed)
PCP: Kathyrn Lass, MD   Primary EP: Dr Rayann Heman  Denise Hebert is a 72 y.o. female who presents today for routine electrophysiology followup.  Since last being seen in our clinic, the patient reports doing very well.  Today, she denies symptoms of palpitations, chest pain, shortness of breath,  lower extremity edema, dizziness, presyncope, or syncope.  The patient is otherwise without complaint today.   Past Medical History:  Diagnosis Date  . Allergic rhinitis, cause unspecified   . Atrial tachycardia ([place])   . Back pain   . Basal cell carcinoma   . Calcium increased serum   . Colon polyp    Hyperplastic cecal polyps  . Colon polyp, hyperplastic    Cecal  . HTN (hypertension)   . Hyperlipidemia   . IBS (irritable bowel syndrome)   . Palpitations   . Squamous cell skin cancer   . SVT (supraventricular tachycardia) (HCC)    Past Surgical History:  Procedure Laterality Date  . APPENDECTOMY  1977  . CHOLECYSTECTOMY  1977  . ENDOMETRIAL BIOPSY  11/2002    ROS- all systems are reviewed and negatives except as per HPI above  Current Outpatient Medications  Medication Sig Dispense Refill  . acetaminophen (TYLENOL) 325 MG tablet Take 650 mg by mouth every 6 (six) hours as needed for mild pain.     Marland Kitchen aspirin 81 MG tablet Take 81 mg by mouth daily.    . Cholecalciferol (VITAMIN D3) 1000 units CAPS Take 1,000 Units by mouth daily.    Marland Kitchen diltiazem (DILACOR XR) 120 MG 24 hr capsule Take 1 capsule (120 mg total) by mouth daily. 90 capsule 3  . ibuprofen (ADVIL,MOTRIN) 200 MG tablet Take 400 mg by mouth every 6 (six) hours as needed for moderate pain.    Marland Kitchen losartan (COZAAR) 50 MG tablet Take 50 mg by mouth daily.    . Multiple Vitamin (MULTIVITAMIN) tablet Take 1 tablet by mouth daily.    . Omega-3 Fatty Acids (FISH OIL) 1000 MG CAPS Take 1 capsule by mouth.    . sodium chloride (OCEAN) 0.65 % SOLN nasal spray Place 1 spray into both nostrils as needed for congestion. Use as directed     . TURMERIC PO Take 1 capsule by mouth daily.     No current facility-administered medications for this visit.     Physical Exam: Vitals:   04/30/17 1617  BP: (!) 160/80  Pulse: 69  Weight: 200 lb (90.7 kg)  Height: 5\' 6"  (1.676 m)    GEN- The patient is well appearing, alert and oriented x 3 today.   Head- normocephalic, atraumatic Eyes-  Sclera clear, conjunctiva pink Ears- hearing intact Oropharynx- clear Lungs- Clear to ausculation bilaterally, normal work of breathing Heart- Regular rate and rhythm, no murmurs, rubs or gallops, PMI not laterally displaced GI- soft, NT, ND, + BS Extremities- no clubbing, cyanosis, or edema  EKG tracing ordered today is personally reviewed and shows sinus rhythm 69 bpm, PR 166 msec, QRS 124 msec, QTc 439 msec, rate PVCs, RBBB, LAD  Assessment and Plan:  1. SVT Well controlled Declines ablation  2. HTN Elevated She reports better control at home and does not wish to make change today Increasing losartan would be very reasonable  3. Overweight Body mass index is 32.28 kg/m.   4. HL Labs from PCP reviewed today I agree with Dr Sabra Heck that initiation of statin is reasonable She has been prescribed lipitor but has not started this yet.  She will go ahead and start the medicine  Follow-up with Dr Sabra Heck as scheduled  Return in a year to see me  Thompson Grayer MD, [facility] 04/30/2017 4:29 PM

## 2017-05-20 ENCOUNTER — Other Ambulatory Visit: Payer: Self-pay | Admitting: Internal Medicine

## 2017-05-27 DIAGNOSIS — Z1159 Encounter for screening for other viral diseases: Secondary | ICD-10-CM | POA: Diagnosis not present

## 2017-05-27 DIAGNOSIS — Z79899 Other long term (current) drug therapy: Secondary | ICD-10-CM | POA: Diagnosis not present

## 2017-05-27 DIAGNOSIS — I10 Essential (primary) hypertension: Secondary | ICD-10-CM | POA: Diagnosis not present

## 2017-05-27 DIAGNOSIS — E7800 Pure hypercholesterolemia, unspecified: Secondary | ICD-10-CM | POA: Diagnosis not present

## 2017-07-24 DIAGNOSIS — I10 Essential (primary) hypertension: Secondary | ICD-10-CM | POA: Diagnosis not present

## 2017-07-24 DIAGNOSIS — E7800 Pure hypercholesterolemia, unspecified: Secondary | ICD-10-CM | POA: Diagnosis not present

## 2017-07-24 DIAGNOSIS — Z79899 Other long term (current) drug therapy: Secondary | ICD-10-CM | POA: Diagnosis not present

## 2017-07-24 DIAGNOSIS — Z1159 Encounter for screening for other viral diseases: Secondary | ICD-10-CM | POA: Diagnosis not present

## 2017-12-01 DIAGNOSIS — E669 Obesity, unspecified: Secondary | ICD-10-CM | POA: Diagnosis not present

## 2017-12-01 DIAGNOSIS — Z23 Encounter for immunization: Secondary | ICD-10-CM | POA: Diagnosis not present

## 2017-12-01 DIAGNOSIS — I471 Supraventricular tachycardia: Secondary | ICD-10-CM | POA: Diagnosis not present

## 2017-12-01 DIAGNOSIS — E7800 Pure hypercholesterolemia, unspecified: Secondary | ICD-10-CM | POA: Diagnosis not present

## 2017-12-01 DIAGNOSIS — I10 Essential (primary) hypertension: Secondary | ICD-10-CM | POA: Diagnosis not present

## 2017-12-01 DIAGNOSIS — Z1389 Encounter for screening for other disorder: Secondary | ICD-10-CM | POA: Diagnosis not present

## 2017-12-01 DIAGNOSIS — M8580 Other specified disorders of bone density and structure, unspecified site: Secondary | ICD-10-CM | POA: Diagnosis not present

## 2017-12-01 DIAGNOSIS — Z0000 Encounter for general adult medical examination without abnormal findings: Secondary | ICD-10-CM | POA: Diagnosis not present

## 2017-12-15 DIAGNOSIS — Z803 Family history of malignant neoplasm of breast: Secondary | ICD-10-CM | POA: Diagnosis not present

## 2017-12-15 DIAGNOSIS — Z1231 Encounter for screening mammogram for malignant neoplasm of breast: Secondary | ICD-10-CM | POA: Diagnosis not present

## 2017-12-15 DIAGNOSIS — M8588 Other specified disorders of bone density and structure, other site: Secondary | ICD-10-CM | POA: Diagnosis not present

## 2018-02-19 DIAGNOSIS — H00032 Abscess of right lower eyelid: Secondary | ICD-10-CM | POA: Diagnosis not present

## 2018-04-15 DIAGNOSIS — D1801 Hemangioma of skin and subcutaneous tissue: Secondary | ICD-10-CM | POA: Diagnosis not present

## 2018-04-15 DIAGNOSIS — D044 Carcinoma in situ of skin of scalp and neck: Secondary | ICD-10-CM | POA: Diagnosis not present

## 2018-04-15 DIAGNOSIS — Z85828 Personal history of other malignant neoplasm of skin: Secondary | ICD-10-CM | POA: Diagnosis not present

## 2018-04-15 DIAGNOSIS — D225 Melanocytic nevi of trunk: Secondary | ICD-10-CM | POA: Diagnosis not present

## 2018-04-15 DIAGNOSIS — D485 Neoplasm of uncertain behavior of skin: Secondary | ICD-10-CM | POA: Diagnosis not present

## 2018-04-15 DIAGNOSIS — L821 Other seborrheic keratosis: Secondary | ICD-10-CM | POA: Diagnosis not present

## 2018-04-15 DIAGNOSIS — L814 Other melanin hyperpigmentation: Secondary | ICD-10-CM | POA: Diagnosis not present

## 2018-04-15 DIAGNOSIS — L820 Inflamed seborrheic keratosis: Secondary | ICD-10-CM | POA: Diagnosis not present

## 2018-04-25 DIAGNOSIS — J209 Acute bronchitis, unspecified: Secondary | ICD-10-CM | POA: Diagnosis not present

## 2018-04-28 DIAGNOSIS — D044 Carcinoma in situ of skin of scalp and neck: Secondary | ICD-10-CM | POA: Diagnosis not present

## 2018-04-28 DIAGNOSIS — Z85828 Personal history of other malignant neoplasm of skin: Secondary | ICD-10-CM | POA: Diagnosis not present

## 2018-04-29 DIAGNOSIS — H2513 Age-related nuclear cataract, bilateral: Secondary | ICD-10-CM | POA: Diagnosis not present

## 2018-05-06 ENCOUNTER — Encounter: Payer: Self-pay | Admitting: Internal Medicine

## 2018-05-06 ENCOUNTER — Ambulatory Visit (INDEPENDENT_AMBULATORY_CARE_PROVIDER_SITE_OTHER): Payer: Medicare Other | Admitting: Internal Medicine

## 2018-05-06 VITALS — BP 126/78 | HR 78 | Ht 66.0 in | Wt 206.0 lb

## 2018-05-06 DIAGNOSIS — I10 Essential (primary) hypertension: Secondary | ICD-10-CM | POA: Diagnosis not present

## 2018-05-06 DIAGNOSIS — I471 Supraventricular tachycardia: Secondary | ICD-10-CM

## 2018-05-06 NOTE — Progress Notes (Signed)
Primary EP: Dr Rayann Heman  Denise Hebert is a 73 y.o. female who presents today for routine electrophysiology followup.  Since last being seen in our clinic, the patient reports doing very well.  Today, she denies symptoms of palpitations, chest pain, shortness of breath,  lower extremity edema, dizziness, presyncope, or syncope.  The patient is otherwise without complaint today.   Past Medical History:  Diagnosis Date  . Allergic rhinitis, cause unspecified   . Atrial tachycardia ([place])   . Back pain   . Basal cell carcinoma   . Calcium increased serum   . Colon polyp    Hyperplastic cecal polyps  . Colon polyp, hyperplastic    Cecal  . HTN (hypertension)   . Hyperlipidemia   . IBS (irritable bowel syndrome)   . Palpitations   . Squamous cell skin cancer   . SVT (supraventricular tachycardia) (HCC)    Past Surgical History:  Procedure Laterality Date  . APPENDECTOMY  1977  . CHOLECYSTECTOMY  1977  . ENDOMETRIAL BIOPSY  11/2002    ROS- all systems are reviewed and negatives except as per HPI above  Current Outpatient Medications  Medication Sig Dispense Refill  . acetaminophen (TYLENOL) 325 MG tablet Take 650 mg by mouth every 6 (six) hours as needed for mild pain.     Marland Kitchen aspirin 81 MG tablet Take 81 mg by mouth daily.    Marland Kitchen atorvastatin (LIPITOR) 10 MG tablet Take 10 mg by mouth daily.    . Cholecalciferol (VITAMIN D3) 1000 units CAPS Take 1,000 Units by mouth daily.    Marland Kitchen diltiazem (DILACOR XR) 120 MG 24 hr capsule Take 1 capsule (120 mg total) by mouth daily. 90 capsule 3  . diltiazem (TIAZAC) 120 MG 24 hr capsule TAKE ONE CAPSULE BY MOUTH EVERY DAY 90 capsule 3  . ibuprofen (ADVIL,MOTRIN) 200 MG tablet Take 400 mg by mouth every 6 (six) hours as needed for moderate pain.    Marland Kitchen losartan (COZAAR) 50 MG tablet Take 50 mg by mouth daily.    . Multiple Vitamin (MULTIVITAMIN) tablet Take 1 tablet by mouth daily.    . Omega-3 Fatty Acids (FISH OIL) 1000 MG CAPS Take 1 capsule by  mouth.    . sodium chloride (OCEAN) 0.65 % SOLN nasal spray Place 1 spray into both nostrils as needed for congestion. Use as directed    . TURMERIC PO Take 1 capsule by mouth daily.     No current facility-administered medications for this visit.     Physical Exam: Vitals:   05/06/18 0952  BP: 126/78  Pulse: 78  SpO2: 99%  Weight: 206 lb (93.4 kg)  Height: 5\' 6"  (1.676 m)    GEN- The patient is well appearing, alert and oriented x 3 today.   Head- normocephalic, atraumatic Eyes-  Sclera clear, conjunctiva pink Ears- hearing intact Oropharynx- clear Lungs- Clear to ausculation bilaterally, normal work of breathing Heart- Regular rate and rhythm, no murmurs, rubs or gallops, PMI not laterally displaced GI- soft, NT, ND, + BS Extremities- no clubbing, cyanosis, or edema  Wt Readings from Last 3 Encounters:  05/06/18 206 lb (93.4 kg)  04/30/17 200 lb (90.7 kg)  04/10/16 197 lb 9.6 oz (89.6 kg)    EKG tracing ordered today is personally reviewed and shows sinus, RBBB  Assessment and Plan:  1. SVT Well controlled No interested in ablation  2. HTN Stable No change required today  3. Overweight Body mass index is 33.25 kg/m. Lifestyle modification encouraged  Return to see EP PA in a year  Thompson Grayer MD, [facility] 05/06/2018 10:45 AM

## 2018-05-06 NOTE — Patient Instructions (Addendum)

## 2018-05-07 ENCOUNTER — Other Ambulatory Visit: Payer: Self-pay | Admitting: Internal Medicine

## 2018-10-14 DIAGNOSIS — M79644 Pain in right finger(s): Secondary | ICD-10-CM | POA: Diagnosis not present

## 2018-11-02 ENCOUNTER — Other Ambulatory Visit: Payer: Self-pay | Admitting: Internal Medicine

## 2018-11-03 NOTE — Telephone Encounter (Signed)
Pt's pharmacy is requesting a refill on diltiazem (Tiazac) 120 capsule. Pt has two diltiazem medications on pt's med list. Can one be D/C to correct pt's medication list? Please address

## 2018-11-17 DIAGNOSIS — R5383 Other fatigue: Secondary | ICD-10-CM | POA: Diagnosis not present

## 2018-11-17 DIAGNOSIS — E669 Obesity, unspecified: Secondary | ICD-10-CM | POA: Diagnosis not present

## 2018-11-17 DIAGNOSIS — M064 Inflammatory polyarthropathy: Secondary | ICD-10-CM | POA: Diagnosis not present

## 2018-11-17 DIAGNOSIS — M2550 Pain in unspecified joint: Secondary | ICD-10-CM | POA: Diagnosis not present

## 2018-11-17 DIAGNOSIS — M26621 Arthralgia of right temporomandibular joint: Secondary | ICD-10-CM | POA: Diagnosis not present

## 2018-11-17 DIAGNOSIS — Z6832 Body mass index (BMI) 32.0-32.9, adult: Secondary | ICD-10-CM | POA: Diagnosis not present

## 2018-11-24 ENCOUNTER — Other Ambulatory Visit: Payer: Self-pay | Admitting: Internal Medicine

## 2018-11-26 ENCOUNTER — Other Ambulatory Visit: Payer: Self-pay | Admitting: Internal Medicine

## 2018-11-26 MED ORDER — DILTIAZEM HCL ER 120 MG PO CP24: 120.0000 mg | ORAL_CAPSULE | Freq: Every day | ORAL | 1 refills | Status: DC

## 2018-12-05 DIAGNOSIS — Z23 Encounter for immunization: Secondary | ICD-10-CM | POA: Diagnosis not present

## 2018-12-16 DIAGNOSIS — E669 Obesity, unspecified: Secondary | ICD-10-CM | POA: Diagnosis not present

## 2018-12-16 DIAGNOSIS — I471 Supraventricular tachycardia: Secondary | ICD-10-CM | POA: Diagnosis not present

## 2018-12-16 DIAGNOSIS — M064 Inflammatory polyarthropathy: Secondary | ICD-10-CM | POA: Diagnosis not present

## 2018-12-16 DIAGNOSIS — Z0000 Encounter for general adult medical examination without abnormal findings: Secondary | ICD-10-CM | POA: Diagnosis not present

## 2018-12-16 DIAGNOSIS — M2550 Pain in unspecified joint: Secondary | ICD-10-CM | POA: Diagnosis not present

## 2018-12-16 DIAGNOSIS — E7800 Pure hypercholesterolemia, unspecified: Secondary | ICD-10-CM | POA: Diagnosis not present

## 2018-12-16 DIAGNOSIS — Z6841 Body Mass Index (BMI) 40.0 and over, adult: Secondary | ICD-10-CM | POA: Diagnosis not present

## 2018-12-16 DIAGNOSIS — M859 Disorder of bone density and structure, unspecified: Secondary | ICD-10-CM | POA: Diagnosis not present

## 2018-12-16 DIAGNOSIS — I10 Essential (primary) hypertension: Secondary | ICD-10-CM | POA: Diagnosis not present

## 2018-12-16 DIAGNOSIS — M26621 Arthralgia of right temporomandibular joint: Secondary | ICD-10-CM | POA: Diagnosis not present

## 2018-12-21 DIAGNOSIS — Z803 Family history of malignant neoplasm of breast: Secondary | ICD-10-CM | POA: Diagnosis not present

## 2018-12-21 DIAGNOSIS — Z1231 Encounter for screening mammogram for malignant neoplasm of breast: Secondary | ICD-10-CM | POA: Diagnosis not present

## 2019-03-25 DIAGNOSIS — M2550 Pain in unspecified joint: Secondary | ICD-10-CM | POA: Diagnosis not present

## 2019-03-25 DIAGNOSIS — M064 Inflammatory polyarthropathy: Secondary | ICD-10-CM | POA: Diagnosis not present

## 2019-03-25 DIAGNOSIS — M26621 Arthralgia of right temporomandibular joint: Secondary | ICD-10-CM | POA: Diagnosis not present

## 2019-03-31 ENCOUNTER — Ambulatory Visit: Payer: Self-pay

## 2019-03-31 DIAGNOSIS — M064 Inflammatory polyarthropathy: Secondary | ICD-10-CM | POA: Diagnosis not present

## 2019-04-09 ENCOUNTER — Ambulatory Visit: Payer: Medicare Other | Attending: Internal Medicine

## 2019-04-09 DIAGNOSIS — Z23 Encounter for immunization: Secondary | ICD-10-CM | POA: Insufficient documentation

## 2019-04-09 NOTE — Progress Notes (Signed)
   Covid-19 Vaccination Clinic  Name:  Denise Hebert    MRN: [id number] DOB: 12/01/1945  04/09/2019  [name] was observed post Covid-19 immunization for 15 minutes without incidence. She was provided with Vaccine Information Sheet and instruction to access the V-Safe system.   [name] was instructed to call 911 with any severe reactions post vaccine: Marland Kitchen Difficulty breathing  . Swelling of your face and throat  . A fast heartbeat  . A bad rash all over your body  . Dizziness and weakness    Immunizations Administered    Name Date Dose VIS Date Route   Pfizer COVID-19 Vaccine 04/09/2019 12:54 PM 0.3 mL 02/12/2019 Intramuscular   Manufacturer: [place]   Lot: [id number]   [place]: [id number]

## 2019-04-17 ENCOUNTER — Ambulatory Visit: Payer: Self-pay

## 2019-05-04 ENCOUNTER — Ambulatory Visit: Payer: Medicare Other | Attending: Internal Medicine

## 2019-05-04 DIAGNOSIS — Z23 Encounter for immunization: Secondary | ICD-10-CM | POA: Insufficient documentation

## 2019-05-04 NOTE — Progress Notes (Signed)
   Covid-19 Vaccination Clinic  Name:  Denise Hebert    MRN: [id number] DOB: 1945/07/24  05/04/2019  [name] was observed post Covid-19 immunization for 15 minutes without incident. She was provided with Vaccine Information Sheet and instruction to access the V-Safe system.   [name] was instructed to call 911 with any severe reactions post vaccine: Marland Kitchen Difficulty breathing  . Swelling of face and throat  . A fast heartbeat  . A bad rash all over body  . Dizziness and weakness   Immunizations Administered    Name Date Dose VIS Date Route   Pfizer COVID-19 Vaccine 05/04/2019 12:56 PM 0.3 mL 02/12/2019 Intramuscular   Manufacturer: [place]   Lot: [id number]   [place]: [id number]

## 2019-05-18 ENCOUNTER — Other Ambulatory Visit: Payer: Self-pay | Admitting: Internal Medicine

## 2019-05-18 MED ORDER — DILTIAZEM HCL ER 120 MG PO CP24: 120.0000 mg | ORAL_CAPSULE | Freq: Every day | ORAL | 0 refills | Status: DC

## 2019-05-28 DIAGNOSIS — D225 Melanocytic nevi of trunk: Secondary | ICD-10-CM | POA: Diagnosis not present

## 2019-05-28 DIAGNOSIS — C4362 Malignant melanoma of left upper limb, including shoulder: Secondary | ICD-10-CM | POA: Diagnosis not present

## 2019-05-28 DIAGNOSIS — D485 Neoplasm of uncertain behavior of skin: Secondary | ICD-10-CM | POA: Diagnosis not present

## 2019-05-28 DIAGNOSIS — L570 Actinic keratosis: Secondary | ICD-10-CM | POA: Diagnosis not present

## 2019-05-28 DIAGNOSIS — L820 Inflamed seborrheic keratosis: Secondary | ICD-10-CM | POA: Diagnosis not present

## 2019-05-28 DIAGNOSIS — Z85828 Personal history of other malignant neoplasm of skin: Secondary | ICD-10-CM | POA: Diagnosis not present

## 2019-05-28 DIAGNOSIS — L821 Other seborrheic keratosis: Secondary | ICD-10-CM | POA: Diagnosis not present

## 2019-06-07 NOTE — Progress Notes (Signed)
Cardiology Office Note Date:  06/09/2019  Patient ID:  Denise, Hebert 1945-05-04, MRN [id number] PCP:  Kathyrn Lass, MD  Electrophysiologist:  Dr. Rayann Heman     Chief Complaint:  annual EP visit   History of Present Illness: Denise Hebert is a 74 y.o. female with history of HTN, HLD, IBS, SVT, HTN, overweight.  She comes today to be seen for Dr. Rayann Heman.  Last seen by him March 2020, she was doing well, SVT controlled with her current regime, the patient not interested in pursuing ablation.  Advised lifestyle modification for weight loss.  No changes were made to her medicines. Planned for annual APP visit  She is doing well.  She and her husband had many travel plans for the past year and this year and disappointed but cancelled them al for now.  Hoping to be able to get back at it "while we still can!". She is busy through her days, active around the house with no difficulties with her ADLs.  No specific exercising though.  No palpitations.  No CP, SOB, DOE, no dizzy spells, near syncope or syncope.  She is very comfortable with her current medicines and would not want to come off the dilt.  She monitors her BP at home and usually runs 130-135 and 70's  Past Medical History:  Diagnosis Date  . Allergic rhinitis, cause unspecified   . Atrial tachycardia ([place])   . Back pain   . Basal cell carcinoma   . Calcium increased serum   . Colon polyp    Hyperplastic cecal polyps  . Colon polyp, hyperplastic    Cecal  . HTN (hypertension)   . Hyperlipidemia   . IBS (irritable bowel syndrome)   . Palpitations   . Squamous cell skin cancer   . SVT (supraventricular tachycardia) (HCC)     Past Surgical History:  Procedure Laterality Date  . APPENDECTOMY  1977  . CHOLECYSTECTOMY  1977  . ENDOMETRIAL BIOPSY  11/2002    Current Outpatient Medications  Medication Sig Dispense Refill  . acetaminophen (TYLENOL) 325 MG tablet Take 650 mg by mouth every 6 (six) hours as  needed for mild pain.     Marland Kitchen aspirin 81 MG tablet Take 81 mg by mouth daily.    Marland Kitchen atorvastatin (LIPITOR) 10 MG tablet Take 10 mg by mouth daily.    . Cholecalciferol (VITAMIN D3) 1000 units CAPS Take 1,000 Units by mouth daily.    Marland Kitchen diltiazem (TIADYLT ER) 120 MG 24 hr capsule Take 120 mg by mouth daily.    Marland Kitchen ibuprofen (ADVIL,MOTRIN) 200 MG tablet Take 400 mg by mouth every 6 (six) hours as needed for moderate pain.    Marland Kitchen losartan (COZAAR) 50 MG tablet Take 50 mg by mouth daily.    . meloxicam (MOBIC) 15 MG tablet Take 15 mg by mouth daily.    . Multiple Vitamin (MULTIVITAMIN) tablet Take 1 tablet by mouth daily.    . Omega-3 Fatty Acids (FISH OIL) 1000 MG CAPS Take 1 capsule by mouth.    . sodium chloride (OCEAN) 0.65 % SOLN nasal spray Place 1 spray into both nostrils as needed for congestion. Use as directed    . TURMERIC PO Take 1 capsule by mouth daily.     No current facility-administered medications for this visit.    Allergies:   Codeine and Penicillins   Social History:  The patient  reports that she has never smoked. She has never used smokeless tobacco. She  reports current alcohol use. She reports that she does not use drugs.   Family History:  The patient's family history includes Alzheimer's disease in her mother; Colon cancer in her father; Congestive Heart Failure in her father; Diabetes Mellitus II in her father; Hypertension in her father and mother; Osteoarthritis in her sister.  ROS:  Please see the history of present illness.  All other systems are reviewed and otherwise negative.   PHYSICAL EXAM:  VS:  BP (!) 164/84   Pulse 63   Ht 5\' 6"  (1.676 m)   Wt 206 lb (93.4 kg)   BMI 33.25 kg/m  BMI: Body mass index is 33.25 kg/m. Well nourished, well developed, in no acute distress  HEENT: normocephalic, atraumatic  Neck: no JVD, carotid bruits or masses Cardiac:  RRR; no significant murmurs, no rubs, or gallops Lungs:  CTA b/l, no wheezing, rhonchi or rales  Abd:  soft, nontender MS: no deformity or atrophy Ext:  no edema  Skin: warm and dry, no rash Neuro:  No gross deficits appreciated Psych: euthymic mood, full affect   EKG:  Done today and reviewed by myself shows  SR 63bpm, RBBB, LAD, unchanged   06/22/2013: TTE Study Conclusions  - Left ventricle: The cavity size was normal. Wall thickness  was normal. Systolic function was normal. The estimated  ejection fraction was in the range of 60% to 65%. Wall  motion was normal; there were no regional wall motion  abnormalities. Features are consistent with a pseudonormal  left ventricular filling pattern, with concomitant  abnormal relaxation and increased filling pressure (grade  2 diastolic dysfunction).  - Aortic valve: There was no stenosis.  - Mitral valve: Trivial regurgitation.  - Left atrium: The atrium was mildly dilated.  - Right ventricle: The cavity size was normal. Systolic  function was normal.  - Tricuspid valve: Peak RV-RA gradient: 57mm Hg (S).  - Pulmonary arteries: PA peak pressure: 56mm Hg (S).   Impressions:  - Normal LV size and systolic function, EF [id number]. Moderate  diastolic dysfunction. Normal RV size and systolic  function. No significant valvular abnormalities.  Transthoracic echocardiography. M-mode, complete 2D,  spectral Doppler, and color Doppler. Height: Height:  170.2cm. Height: 67in. Weight: Weight: 86.6kg. Weight:  190.6lb. Body mass index: BMI: 29.9kg/m^2. Body surface  area:  BSA: 1.47m^2. Blood pressure:   157/81. Patient  status: Outpatient. Location: [facility]     Recent Labs: No results found for requested labs within last 8760 hours.  No results found for requested labs within last 8760 hours.   CrCl cannot be calculated (Patient's most recent lab result is older than the maximum 21 days allowed.).   Wt Readings from Last 3 Encounters:  06/09/19 206 lb (93.4 kg)  05/06/18 206 lb (93.4 kg)   04/30/17 200 lb (90.7 kg)     Other studies reviewed: Additional studies/records reviewed today include: summarized above  ASSESSMENT AND PLAN:  1. SVT      Well controlled with current regime  2. HTN     High here today     Well controlled by her home readings   Discussed exercise, weight loss, healthy lifestyle.  She is active, but will try to get in the habit of walking with her husband.     Disposition: F/u with [place] in a year, sooner if needed  Current medicines are reviewed at length with the patient today.  The patient did not have any concerns regarding medicines.  Signed, Tommye Standard,  PA-C 06/09/2019 1:39 PM     [place] [id number] [phone] (office)  [phone] (fax)

## 2019-06-08 ENCOUNTER — Ambulatory Visit: Payer: Medicare Other | Admitting: Physician Assistant

## 2019-06-08 DIAGNOSIS — Z85820 Personal history of malignant melanoma of skin: Secondary | ICD-10-CM | POA: Diagnosis not present

## 2019-06-08 DIAGNOSIS — Z85828 Personal history of other malignant neoplasm of skin: Secondary | ICD-10-CM | POA: Diagnosis not present

## 2019-06-08 DIAGNOSIS — C4362 Malignant melanoma of left upper limb, including shoulder: Secondary | ICD-10-CM | POA: Diagnosis not present

## 2019-06-08 DIAGNOSIS — D0362 Melanoma in situ of left upper limb, including shoulder: Secondary | ICD-10-CM | POA: Diagnosis not present

## 2019-06-09 ENCOUNTER — Other Ambulatory Visit: Payer: Self-pay

## 2019-06-09 ENCOUNTER — Ambulatory Visit (INDEPENDENT_AMBULATORY_CARE_PROVIDER_SITE_OTHER): Payer: Medicare Other | Admitting: Physician Assistant

## 2019-06-09 VITALS — BP 164/84 | HR 63 | Ht 66.0 in | Wt 206.0 lb

## 2019-06-09 DIAGNOSIS — I471 Supraventricular tachycardia: Secondary | ICD-10-CM | POA: Diagnosis not present

## 2019-06-09 DIAGNOSIS — I10 Essential (primary) hypertension: Secondary | ICD-10-CM

## 2019-06-09 MED ORDER — LOSARTAN POTASSIUM 50 MG PO TABS: 50.0000 mg | ORAL_TABLET | Freq: Every day | ORAL | 3 refills | Status: DC

## 2019-06-09 MED ORDER — DILTIAZEM HCL ER BEADS 120 MG PO CP24: 120.0000 mg | ORAL_CAPSULE | Freq: Every day | ORAL | 3 refills | Status: DC

## 2019-06-09 NOTE — Patient Instructions (Signed)

## 2019-09-28 DIAGNOSIS — M26621 Arthralgia of right temporomandibular joint: Secondary | ICD-10-CM | POA: Diagnosis not present

## 2019-09-28 DIAGNOSIS — M2550 Pain in unspecified joint: Secondary | ICD-10-CM | POA: Diagnosis not present

## 2019-09-28 DIAGNOSIS — Z6833 Body mass index (BMI) 33.0-33.9, adult: Secondary | ICD-10-CM | POA: Diagnosis not present

## 2019-09-28 DIAGNOSIS — E669 Obesity, unspecified: Secondary | ICD-10-CM | POA: Diagnosis not present

## 2019-09-28 DIAGNOSIS — M064 Inflammatory polyarthropathy: Secondary | ICD-10-CM | POA: Diagnosis not present

## 2019-12-08 DIAGNOSIS — Z23 Encounter for immunization: Secondary | ICD-10-CM | POA: Diagnosis not present

## 2019-12-10 DIAGNOSIS — L821 Other seborrheic keratosis: Secondary | ICD-10-CM | POA: Diagnosis not present

## 2019-12-10 DIAGNOSIS — Z85828 Personal history of other malignant neoplasm of skin: Secondary | ICD-10-CM | POA: Diagnosis not present

## 2019-12-10 DIAGNOSIS — Z85820 Personal history of malignant melanoma of skin: Secondary | ICD-10-CM | POA: Diagnosis not present

## 2019-12-10 DIAGNOSIS — D2272 Melanocytic nevi of left lower limb, including hip: Secondary | ICD-10-CM | POA: Diagnosis not present

## 2019-12-10 DIAGNOSIS — L814 Other melanin hyperpigmentation: Secondary | ICD-10-CM | POA: Diagnosis not present

## 2019-12-27 DIAGNOSIS — Z1231 Encounter for screening mammogram for malignant neoplasm of breast: Secondary | ICD-10-CM | POA: Diagnosis not present

## 2019-12-27 DIAGNOSIS — Z85820 Personal history of malignant melanoma of skin: Secondary | ICD-10-CM | POA: Diagnosis not present

## 2019-12-27 DIAGNOSIS — Z800 Family history of malignant neoplasm of digestive organs: Secondary | ICD-10-CM | POA: Diagnosis not present

## 2019-12-27 DIAGNOSIS — K5901 Slow transit constipation: Secondary | ICD-10-CM | POA: Diagnosis not present

## 2019-12-27 DIAGNOSIS — M859 Disorder of bone density and structure, unspecified: Secondary | ICD-10-CM | POA: Diagnosis not present

## 2019-12-27 DIAGNOSIS — I10 Essential (primary) hypertension: Secondary | ICD-10-CM | POA: Diagnosis not present

## 2019-12-27 DIAGNOSIS — I471 Supraventricular tachycardia: Secondary | ICD-10-CM | POA: Diagnosis not present

## 2019-12-27 DIAGNOSIS — Z86010 Personal history of colonic polyps: Secondary | ICD-10-CM | POA: Diagnosis not present

## 2019-12-27 DIAGNOSIS — E7800 Pure hypercholesterolemia, unspecified: Secondary | ICD-10-CM | POA: Diagnosis not present

## 2019-12-27 DIAGNOSIS — Z6833 Body mass index (BMI) 33.0-33.9, adult: Secondary | ICD-10-CM | POA: Diagnosis not present

## 2019-12-27 DIAGNOSIS — Z0000 Encounter for general adult medical examination without abnormal findings: Secondary | ICD-10-CM | POA: Diagnosis not present

## 2020-04-13 DIAGNOSIS — Z79899 Other long term (current) drug therapy: Secondary | ICD-10-CM | POA: Diagnosis not present

## 2020-04-13 DIAGNOSIS — E669 Obesity, unspecified: Secondary | ICD-10-CM | POA: Diagnosis not present

## 2020-04-13 DIAGNOSIS — M2550 Pain in unspecified joint: Secondary | ICD-10-CM | POA: Diagnosis not present

## 2020-04-13 DIAGNOSIS — Z6833 Body mass index (BMI) 33.0-33.9, adult: Secondary | ICD-10-CM | POA: Diagnosis not present

## 2020-04-13 DIAGNOSIS — M150 Primary generalized (osteo)arthritis: Secondary | ICD-10-CM | POA: Diagnosis not present

## 2020-04-13 DIAGNOSIS — M064 Inflammatory polyarthropathy: Secondary | ICD-10-CM | POA: Diagnosis not present

## 2020-06-08 ENCOUNTER — Encounter: Payer: Self-pay | Admitting: Physician Assistant

## 2020-06-08 ENCOUNTER — Other Ambulatory Visit: Payer: Self-pay

## 2020-06-08 ENCOUNTER — Ambulatory Visit (INDEPENDENT_AMBULATORY_CARE_PROVIDER_SITE_OTHER): Payer: Medicare Other | Admitting: Physician Assistant

## 2020-06-08 VITALS — BP 154/80 | HR 68 | Ht 67.0 in | Wt 211.0 lb

## 2020-06-08 DIAGNOSIS — I10 Essential (primary) hypertension: Secondary | ICD-10-CM | POA: Diagnosis not present

## 2020-06-08 DIAGNOSIS — I471 Supraventricular tachycardia: Secondary | ICD-10-CM

## 2020-06-08 NOTE — Progress Notes (Signed)
Cardiology Office Note Date:  06/08/2020  Patient ID:  Denise Hebert, Denise Hebert [birth year], MRN [id number] PCP:  Kathyrn Lass, MD  Electrophysiologist:  Dr. Rayann Heman     Chief Complaint:  annual EP visit   History of Present Illness: Denise Hebert is a 75 y.o. female with history of HTN, HLD, IBS, SVT, HTN, overweight, RBBB  She comes today to be seen for Dr. Rayann Heman.  Last seen by him March 2020, she was doing well, SVT controlled with her current regime, the patient not interested in pursuing ablation.  Advised lifestyle modification for weight loss.  No changes were made to her medicines. Planned for annual APP visit  I saw her 06/09/19 She is doing well.  She and her husband had many travel plans for the past year and this year and disappointed but cancelled them al for now.  Hoping to be able to get back at it "while we still can!". She is busy through her days, active around the house with no difficulties with her ADLs.  No specific exercising though.  No palpitations.  No CP, SOB, DOE, no dizzy spells, near syncope or syncope. She is very comfortable with her current medicines and would not want to come off the dilt. She monitors her BP at home and usually runs 130-135 and 70's  No changes were made, planned for anaul visits  TODAY She continues to do quite well. No symptoms of her SVT, infrequent skipped beats only. No CP, SOB, DOE No dizziness, near syncope ro syncope.  She and her husband traveled this past year, went to [place], visited a couple parks, felt well with good exertional capacity.  Labs done with her PMD annually   Past Medical History:  Diagnosis Date  . Allergic rhinitis, cause unspecified   . Atrial tachycardia ([place])   . Back pain   . Basal cell carcinoma   . Calcium increased serum   . Colon polyp    Hyperplastic cecal polyps  . Colon polyp, hyperplastic    Cecal  . HTN (hypertension)   . Hyperlipidemia   . IBS (irritable bowel syndrome)    . Palpitations   . Squamous cell skin cancer   . SVT (supraventricular tachycardia) ([facility])     Past Surgical History:  Procedure Laterality Date  . APPENDECTOMY  1977  . CHOLECYSTECTOMY  1977  . ENDOMETRIAL BIOPSY  11/2002    Current Outpatient Medications  Medication Sig Dispense Refill  . acetaminophen (TYLENOL) 325 MG tablet Take 650 mg by mouth every 6 (six) hours as needed for mild pain.     Marland Kitchen aspirin 81 MG tablet Take 81 mg by mouth daily.    Marland Kitchen atorvastatin (LIPITOR) 10 MG tablet Take 10 mg by mouth daily.    . Cholecalciferol (VITAMIN D3) 1000 units CAPS Take 1,000 Units by mouth daily.    Marland Kitchen diltiazem (TIADYLT ER) 120 MG 24 hr capsule Take 1 capsule (120 mg total) by mouth daily. 90 capsule 3  . ibuprofen (ADVIL,MOTRIN) 200 MG tablet Take 400 mg by mouth every 6 (six) hours as needed for moderate pain.    Marland Kitchen losartan (COZAAR) 50 MG tablet Take 1 tablet (50 mg total) by mouth daily. 90 tablet 3  . meloxicam (MOBIC) 15 MG tablet Take 15 mg by mouth daily.    . Multiple Vitamin (MULTIVITAMIN) tablet Take 1 tablet by mouth daily.    . Omega-3 Fatty Acids (FISH OIL) 1000 MG CAPS Take 1 capsule by mouth.    Marland Kitchen  sodium chloride (OCEAN) 0.65 % SOLN nasal spray Place 1 spray into both nostrils as needed for congestion. Use as directed    . TURMERIC PO Take 1 capsule by mouth daily.     No current facility-administered medications for this visit.    Allergies:   Codeine and Penicillins   Social History:  The patient  reports that she has never smoked. She has never used smokeless tobacco. She reports current alcohol use. She reports that she does not use drugs.   Family History:  The patient's family history includes Alzheimer's disease in her mother; Colon cancer in her father; Congestive Heart Failure in her father; Diabetes Mellitus II in her father; Hypertension in her father and mother; Osteoarthritis in her sister.  ROS:  Please see the history of present illness.  All other  systems are reviewed and otherwise negative.   PHYSICAL EXAM:  VS:  There were no vitals taken for this visit. BMI: There is no height or weight on file to calculate BMI. Well nourished, well developed, in no acute distress  HEENT: normocephalic, atraumatic  Neck: no JVD, carotid bruits or masses Cardiac:  RRR; no significant murmurs, no rubs, or gallops Lungs:  CTA b/l, no wheezing, rhonchi or rales  Abd: soft, nontender MS: no deformity or atrophy Ext:   no edema  Skin: warm and dry, no rash Neuro:  No gross deficits appreciated Psych: euthymic mood, full affect   EKG:  Done today and reviewed by myself shows  SR 68bpm, RBBB, LAD, unchanged 06/09/19: SR 63bpm, RBBB, LAD, unchanged   06/22/2013: TTE Study Conclusions  - Left ventricle: The cavity size was normal. Wall thickness  was normal. Systolic function was normal. The estimated  ejection fraction was in the range of 60% to 65%. Wall  motion was normal; there were no regional wall motion  abnormalities. Features are consistent with a pseudonormal  left ventricular filling pattern, with concomitant  abnormal relaxation and increased filling pressure (grade  2 diastolic dysfunction).  - Aortic valve: There was no stenosis.  - Mitral valve: Trivial regurgitation.  - Left atrium: The atrium was mildly dilated.  - Right ventricle: The cavity size was normal. Systolic  function was normal.  - Tricuspid valve: Peak RV-RA gradient: 37mm Hg (S).  - Pulmonary arteries: PA peak pressure: 48mm Hg (S).   Impressions:  - Normal LV size and systolic function, EF 19-50%. Moderate  diastolic dysfunction. Normal RV size and systolic  function. No significant valvular abnormalities.  Transthoracic echocardiography. M-mode, complete 2D,  spectral Doppler, and color Doppler. Height: Height:  170.2cm. Height: 67in. Weight: Weight: 86.6kg. Weight:  190.6lb. Body mass index: BMI: 29.9kg/m^2. Body surface  area:   BSA: 1.62m^2. Blood pressure:   157/81. Patient  status: Outpatient. Location: [facility]     Recent Labs: No results found for requested labs within last 8760 hours.  No results found for requested labs within last 8760 hours.   CrCl cannot be calculated (Patient's most recent lab result is older than the maximum 21 days allowed.).   Wt Readings from Last 3 Encounters:  06/09/19 206 lb (93.4 kg)  05/06/18 206 lb (93.4 kg)  04/30/17 200 lb (90.7 kg)     Other studies reviewed: Additional studies/records reviewed today include: summarized above  ASSESSMENT AND PLAN:  1. SVT      Well controlled with current regime  2. HTN     High here today     Well controlled by  her home readings, reports 130's/60's  Re-discussed exercise, weight loss, healthy lifestyle.  She is active, but will try to get in the habit of walking with her husband.     Disposition: offered PRN visits, she would like to keep annual appointments  Current medicines are reviewed at length with the patient today.  The patient did not have any concerns regarding medicines.  Venetia Night, PA-C 06/08/2020 4:42 AM     [facility] [address] [place] [address] [phone] (office)  [phone] (fax)

## 2020-06-08 NOTE — Patient Instructions (Signed)
Medication Instructions:   Your physician recommends that you continue on your current medications as directed. Please refer to the Current Medication list given to you today.  *If you need a refill on your cardiac medications before your next appointment, please call your pharmacy*   Lab Work: NONE ORDERED  TODAY   If you have labs (blood work) drawn today and your tests are completely normal, you will receive your results only by: . MyChart Message (if you have MyChart) OR . A paper copy in the mail If you have any lab test that is abnormal or we need to change your treatment, we will call you to review the results.   Testing/Procedures: NONE ORDERED  TODAY    Follow-Up: At [facility], you and your health needs are our priority.  As part of our continuing mission to provide you with exceptional heart care, we have created designated Provider Care Teams.  These Care Teams include your primary Cardiologist (physician) and Advanced Practice Providers (APPs -  Physician Assistants and Nurse Practitioners) who all work together to provide you with the care you need, when you need it.  We recommend signing up for the patient portal called "MyChart".  Sign up information is provided on this After Visit Summary.  MyChart is used to connect with patients for Virtual Visits (Telemedicine).  Patients are able to view lab/test results, encounter notes, upcoming appointments, etc.  Non-urgent messages can be sent to your provider as well.   To learn more about what you can do with MyChart, go to [email or web address].    Your next appointment:   1 year(s)  The format for your next appointment:   In Person  Provider:   Renee Ursuy, PA-C   Other Instructions   

## 2020-06-11 ENCOUNTER — Other Ambulatory Visit: Payer: Self-pay | Admitting: Physician Assistant

## 2020-06-19 ENCOUNTER — Other Ambulatory Visit: Payer: Self-pay | Admitting: Physician Assistant

## 2020-07-07 DIAGNOSIS — L820 Inflamed seborrheic keratosis: Secondary | ICD-10-CM | POA: Diagnosis not present

## 2020-07-07 DIAGNOSIS — L814 Other melanin hyperpigmentation: Secondary | ICD-10-CM | POA: Diagnosis not present

## 2020-07-07 DIAGNOSIS — L821 Other seborrheic keratosis: Secondary | ICD-10-CM | POA: Diagnosis not present

## 2020-07-07 DIAGNOSIS — Z85820 Personal history of malignant melanoma of skin: Secondary | ICD-10-CM | POA: Diagnosis not present

## 2020-07-07 DIAGNOSIS — D1801 Hemangioma of skin and subcutaneous tissue: Secondary | ICD-10-CM | POA: Diagnosis not present

## 2020-07-07 DIAGNOSIS — L570 Actinic keratosis: Secondary | ICD-10-CM | POA: Diagnosis not present

## 2020-07-07 DIAGNOSIS — Z85828 Personal history of other malignant neoplasm of skin: Secondary | ICD-10-CM | POA: Diagnosis not present

## 2020-07-07 DIAGNOSIS — D485 Neoplasm of uncertain behavior of skin: Secondary | ICD-10-CM | POA: Diagnosis not present

## 2020-10-24 DIAGNOSIS — E7800 Pure hypercholesterolemia, unspecified: Secondary | ICD-10-CM | POA: Diagnosis not present

## 2020-10-24 DIAGNOSIS — E669 Obesity, unspecified: Secondary | ICD-10-CM | POA: Diagnosis not present

## 2020-10-24 DIAGNOSIS — I10 Essential (primary) hypertension: Secondary | ICD-10-CM | POA: Diagnosis not present

## 2020-10-24 DIAGNOSIS — U071 COVID-19: Secondary | ICD-10-CM | POA: Diagnosis not present

## 2020-11-08 DIAGNOSIS — H524 Presbyopia: Secondary | ICD-10-CM | POA: Diagnosis not present

## 2020-11-08 DIAGNOSIS — H2513 Age-related nuclear cataract, bilateral: Secondary | ICD-10-CM | POA: Diagnosis not present

## 2020-11-08 DIAGNOSIS — H52203 Unspecified astigmatism, bilateral: Secondary | ICD-10-CM | POA: Diagnosis not present

## 2020-11-16 DIAGNOSIS — H2511 Age-related nuclear cataract, right eye: Secondary | ICD-10-CM | POA: Diagnosis not present

## 2020-11-16 DIAGNOSIS — H25811 Combined forms of age-related cataract, right eye: Secondary | ICD-10-CM | POA: Diagnosis not present

## 2020-11-30 DIAGNOSIS — H2512 Age-related nuclear cataract, left eye: Secondary | ICD-10-CM | POA: Diagnosis not present

## 2020-11-30 DIAGNOSIS — H25812 Combined forms of age-related cataract, left eye: Secondary | ICD-10-CM | POA: Diagnosis not present

## 2020-11-30 DIAGNOSIS — H25012 Cortical age-related cataract, left eye: Secondary | ICD-10-CM | POA: Diagnosis not present

## 2020-12-30 DIAGNOSIS — Z23 Encounter for immunization: Secondary | ICD-10-CM | POA: Diagnosis not present

## 2021-01-01 DIAGNOSIS — Z1231 Encounter for screening mammogram for malignant neoplasm of breast: Secondary | ICD-10-CM | POA: Diagnosis not present

## 2021-01-08 DIAGNOSIS — M859 Disorder of bone density and structure, unspecified: Secondary | ICD-10-CM | POA: Diagnosis not present

## 2021-01-08 DIAGNOSIS — Z0000 Encounter for general adult medical examination without abnormal findings: Secondary | ICD-10-CM | POA: Diagnosis not present

## 2021-01-08 DIAGNOSIS — Z85820 Personal history of malignant melanoma of skin: Secondary | ICD-10-CM | POA: Diagnosis not present

## 2021-01-08 DIAGNOSIS — E7800 Pure hypercholesterolemia, unspecified: Secondary | ICD-10-CM | POA: Diagnosis not present

## 2021-01-08 DIAGNOSIS — I10 Essential (primary) hypertension: Secondary | ICD-10-CM | POA: Diagnosis not present

## 2021-01-08 DIAGNOSIS — M130 Polyarthritis, unspecified: Secondary | ICD-10-CM | POA: Diagnosis not present

## 2021-01-08 DIAGNOSIS — Z86010 Personal history of colonic polyps: Secondary | ICD-10-CM | POA: Diagnosis not present

## 2021-01-08 DIAGNOSIS — E669 Obesity, unspecified: Secondary | ICD-10-CM | POA: Diagnosis not present

## 2021-01-08 DIAGNOSIS — I471 Supraventricular tachycardia: Secondary | ICD-10-CM | POA: Diagnosis not present

## 2021-01-16 DIAGNOSIS — Z6834 Body mass index (BMI) 34.0-34.9, adult: Secondary | ICD-10-CM | POA: Diagnosis not present

## 2021-01-16 DIAGNOSIS — M064 Inflammatory polyarthropathy: Secondary | ICD-10-CM | POA: Diagnosis not present

## 2021-01-16 DIAGNOSIS — E669 Obesity, unspecified: Secondary | ICD-10-CM | POA: Diagnosis not present

## 2021-01-16 DIAGNOSIS — M150 Primary generalized (osteo)arthritis: Secondary | ICD-10-CM | POA: Diagnosis not present

## 2021-01-16 DIAGNOSIS — M2550 Pain in unspecified joint: Secondary | ICD-10-CM | POA: Diagnosis not present

## 2021-03-16 DIAGNOSIS — L821 Other seborrheic keratosis: Secondary | ICD-10-CM | POA: Diagnosis not present

## 2021-03-16 DIAGNOSIS — C44311 Basal cell carcinoma of skin of nose: Secondary | ICD-10-CM | POA: Diagnosis not present

## 2021-03-16 DIAGNOSIS — B078 Other viral warts: Secondary | ICD-10-CM | POA: Diagnosis not present

## 2021-03-16 DIAGNOSIS — Z85820 Personal history of malignant melanoma of skin: Secondary | ICD-10-CM | POA: Diagnosis not present

## 2021-03-16 DIAGNOSIS — L720 Epidermal cyst: Secondary | ICD-10-CM | POA: Diagnosis not present

## 2021-03-16 DIAGNOSIS — Z85828 Personal history of other malignant neoplasm of skin: Secondary | ICD-10-CM | POA: Diagnosis not present

## 2021-03-16 DIAGNOSIS — L84 Corns and callosities: Secondary | ICD-10-CM | POA: Diagnosis not present

## 2021-04-18 DIAGNOSIS — Z85820 Personal history of malignant melanoma of skin: Secondary | ICD-10-CM | POA: Diagnosis not present

## 2021-04-18 DIAGNOSIS — Z85828 Personal history of other malignant neoplasm of skin: Secondary | ICD-10-CM | POA: Diagnosis not present

## 2021-04-18 DIAGNOSIS — C44311 Basal cell carcinoma of skin of nose: Secondary | ICD-10-CM | POA: Diagnosis not present

## 2021-04-30 ENCOUNTER — Other Ambulatory Visit: Payer: Self-pay

## 2021-04-30 ENCOUNTER — Emergency Department (HOSPITAL_BASED_OUTPATIENT_CLINIC_OR_DEPARTMENT_OTHER): Payer: Medicare Other | Admitting: Radiology

## 2021-04-30 ENCOUNTER — Encounter (HOSPITAL_BASED_OUTPATIENT_CLINIC_OR_DEPARTMENT_OTHER): Payer: Self-pay

## 2021-04-30 DIAGNOSIS — Z79899 Other long term (current) drug therapy: Secondary | ICD-10-CM | POA: Diagnosis not present

## 2021-04-30 DIAGNOSIS — Z7982 Long term (current) use of aspirin: Secondary | ICD-10-CM | POA: Insufficient documentation

## 2021-04-30 DIAGNOSIS — M1990 Unspecified osteoarthritis, unspecified site: Secondary | ICD-10-CM | POA: Insufficient documentation

## 2021-04-30 DIAGNOSIS — M1711 Unilateral primary osteoarthritis, right knee: Secondary | ICD-10-CM | POA: Diagnosis not present

## 2021-04-30 DIAGNOSIS — M25561 Pain in right knee: Secondary | ICD-10-CM | POA: Insufficient documentation

## 2021-04-30 MED ORDER — LIDOCAINE 5 % EX PTCH
1.0000 | MEDICATED_PATCH | CUTANEOUS | Status: DC
  Administered 2021-05-01: 1 via TRANSDERMAL
  Filled 2021-04-30: qty 1

## 2021-04-30 MED ORDER — NAPROXEN 250 MG PO TABS
500.0000 mg | ORAL_TABLET | Freq: Once | ORAL | Status: AC
  Administered 2021-05-01: 500 mg via ORAL
  Filled 2021-04-30: qty 2

## 2021-04-30 NOTE — ED Triage Notes (Signed)
Patient here POV from Home for Right Knee Pain.  Patient endorses that her Knee "dislocates" occasionally over the past few months and she can usually "pop it back in".   States today approximately 1 hour PTA she stood up from Chair and it occurred again but "it won't pop back in".   No Fall.   NAD Noted during Triage. A&Ox4. GCS 15. BIB Wheelchair.

## 2021-05-01 ENCOUNTER — Emergency Department (HOSPITAL_BASED_OUTPATIENT_CLINIC_OR_DEPARTMENT_OTHER)
Admission: EM | Admit: 2021-05-01 | Discharge: 2021-05-01 | Disposition: A | Discharge status: Home / Self Care | Payer: Medicare Other | Attending: Emergency Medicine | Admitting: Emergency Medicine

## 2021-05-01 DIAGNOSIS — M25561 Pain in right knee: Secondary | ICD-10-CM | POA: Diagnosis not present

## 2021-05-01 DIAGNOSIS — M1711 Unilateral primary osteoarthritis, right knee: Secondary | ICD-10-CM | POA: Diagnosis not present

## 2021-05-01 DIAGNOSIS — M1990 Unspecified osteoarthritis, unspecified site: Secondary | ICD-10-CM

## 2021-05-01 MED ORDER — NAPROXEN 375 MG PO TABS: 375.0000 mg | ORAL_TABLET | Freq: Two times a day (BID) | ORAL | 0 refills | Status: DC

## 2021-05-01 MED ORDER — ACETAMINOPHEN 500 MG PO TABS
1000.0000 mg | ORAL_TABLET | Freq: Once | ORAL | Status: DC
Start: 2021-05-01 — End: 2021-05-01

## 2021-05-01 NOTE — ED Provider Notes (Signed)
[facility] EMERGENCY DEPT Provider Note   CSN: [id number] Arrival date & time: 04/30/21  2009     History  Chief Complaint  Patient presents with   Knee Pain    Denise Hebert is a 76 y.o. female.  The history is provided by the patient.  Knee Pain Location:  Knee Time since incident: off and on for a while, felt a pop 8 hours ago still having pain. Injury: no   Knee location:  R knee Pain details:    Quality:  Aching   Radiates to:  Does not radiate   Severity:  Moderate   Onset quality:  Sudden   Duration:  8 hours   Timing:  Constant   Progression:  Unchanged Chronicity:  New Dislocation: no   Foreign body present:  No foreign bodies Prior injury to area:  No Relieved by:  Nothing Worsened by:  Nothing Ineffective treatments:  None tried Associated symptoms: no back pain, no decreased ROM, no fatigue, no fever, no itching, no muscle weakness, no neck pain, no numbness and no stiffness   Risk factors: no concern for non-accidental trauma       Home Medications Prior to Admission medications   Medication Sig Start Date End Date Taking? Authorizing Provider  naproxen (NAPROSYN) 375 MG tablet Take 1 tablet (375 mg total) by mouth 2 (two) times daily with a meal. 05/01/21  Yes Natasia Sanko, MD  acetaminophen (TYLENOL) 325 MG tablet Take 650 mg by mouth every 6 (six) hours as needed for mild pain.     [provider]  aspirin 81 MG tablet Take 81 mg by mouth daily.    [provider]  atorvastatin (LIPITOR) 10 MG tablet Take 10 mg by mouth daily. 04/13/18   [provider]  Cholecalciferol (VITAMIN D3) 1000 units CAPS Take 1,000 Units by mouth daily.    [provider]  ibuprofen (ADVIL,MOTRIN) 200 MG tablet Take 400 mg by mouth every 6 (six) hours as needed for moderate pain.    [provider]  losartan (COZAAR) 50 MG tablet TAKE 1 TABLET BY MOUTH EVERY DAY 06/14/20   Baldwin Jamaica, PA-C  meloxicam  (MOBIC) 15 MG tablet Take 15 mg by mouth daily. 04/03/19   [provider]  Multiple Vitamin (MULTIVITAMIN) tablet Take 1 tablet by mouth daily.    [provider]  Omega-3 Fatty Acids (FISH OIL) 1000 MG CAPS Take 1 capsule by mouth.    [provider]  sodium chloride (OCEAN) 0.65 % SOLN nasal spray Place 1 spray into both nostrils as needed for congestion. Use as directed    [provider]  TIADYLT ER 120 MG 24 hr capsule TAKE 1 CAPSULE BY MOUTH EVERY DAY 06/21/20   Baldwin Jamaica, PA-C  TURMERIC PO Take 1 capsule by mouth daily.    [provider]      Allergies    Codeine and Penicillins    Review of Systems   Review of Systems  Constitutional:  Negative for fatigue and fever.  HENT:  Negative for facial swelling.   Eyes:  Negative for photophobia and redness.  Respiratory:  Negative for shortness of breath.   Cardiovascular:  Negative for chest pain.  Gastrointestinal:  Negative for abdominal pain.  Genitourinary:  Negative for difficulty urinating.  Musculoskeletal:  Negative for back pain, neck pain and stiffness.  Skin:  Negative for itching.  Neurological:  Negative for facial asymmetry.  Psychiatric/Behavioral:  Negative for agitation.  All other systems reviewed and are negative.  Physical Exam Updated Vital Signs BP (!) 166/71 (BP Location: Right Arm)    Pulse 70    Temp (!) 97.5 F (36.4 C) (Oral)    Resp 16    Ht 5\' 7"  (1.702 m)    Wt 95.7 kg    SpO2 97%    BMI 33.04 kg/m  Physical Exam Vitals and nursing note reviewed.  Constitutional:      General: She is not in acute distress.    Appearance: Normal appearance.  HENT:     Head: Normocephalic and atraumatic.     Nose: Nose normal.  Eyes:     Conjunctiva/sclera: Conjunctivae normal.     Pupils: Pupils are equal, round, and reactive to light.  Cardiovascular:     Rate and Rhythm: Normal rate and regular rhythm.     Pulses: Normal pulses.     Heart sounds: Normal  heart sounds.  Pulmonary:     Effort: Pulmonary effort is normal.     Breath sounds: Normal breath sounds.  Abdominal:     General: Bowel sounds are normal.     Palpations: Abdomen is soft.     Tenderness: There is no abdominal tenderness. There is no guarding.  Musculoskeletal:        General: Normal range of motion.     Cervical back: Normal range of motion and neck supple.     Right knee: No swelling, deformity, effusion, erythema, ecchymosis, lacerations, bony tenderness or crepitus. Normal range of motion. No tenderness. No PCL laxity. Normal alignment and normal patellar mobility. Normal pulse.     Instability Tests: Posterior drawer test negative.     Right lower leg: Normal.     Right ankle: Normal.     Right Achilles Tendon: Normal.     Left ankle: Normal.     Left Achilles Tendon: Normal.     Right foot: Normal.     Left foot: Normal.  Skin:    General: Skin is warm and dry.     Capillary Refill: Capillary refill takes less than 2 seconds.  Neurological:     General: No focal deficit present.     Mental Status: She is alert and oriented to person, place, and time.     Deep Tendon Reflexes: Reflexes normal.  Psychiatric:        Mood and Affect: Mood normal.        Behavior: Behavior normal.    ED Results / Procedures / Treatments   Labs (all labs ordered are listed, but only abnormal results are displayed) Labs Reviewed - No data to display  EKG None  Radiology DG Knee Complete 4 Views Right  Result Date: 04/30/2021 CLINICAL DATA:  Right knee pain. EXAM: RIGHT KNEE - COMPLETE 4+ VIEW COMPARISON:  None. FINDINGS: Moderate tricompartmental degenerative changes most significant in the lateral compartment. No acute bony findings or osteochondral lesion. No definite joint effusion. IMPRESSION: Moderate tricompartmental degenerative changes. No acute bony findings. Electronically Signed   By: Marijo Sanes M.D.   On: 04/30/2021 20:48    Procedures Procedures     Medications Ordered in ED Medications  lidocaine (LIDODERM) 5 % 1 patch (1 patch Transdermal Patch Applied 05/01/21 0044)  acetaminophen (TYLENOL) tablet 1,000 mg (has no administration in time range)  naproxen (NAPROSYN) tablet 500 mg (500 mg Oral Given 05/01/21 0044)    ED Course/ Medical Decision Making/ A&P  Medical Decision Making Patient with knee pain intermittently felt a pop tonight and had pain and did not take any medications   Amount and/or Complexity of Data Reviewed Independent Historian: spouse    Details: see above Radiology: ordered and independent interpretation performed.    Details: reviewed by me: arthritis of the knee.  No acute fractures  Risk OTC drugs. Prescription drug management. Risk Details: Arthritis with likely ligamentous issue.  Tylenol and naproxen,  immobilizer placed,  ice and elevate the leg and wear the immobilizer at all times.  Call in am to be seen by orthopedics     Final Clinical Impression(s) / ED Diagnoses Final diagnoses:  Acute pain of right knee  Arthritis  Return for intractable cough, coughing up blood, fevers > 100.4 unrelieved by medication, shortness of breath, intractable vomiting, chest pain, shortness of breath, weakness, numbness, changes in speech, facial asymmetry, abdominal pain, passing out, Inability to tolerate liquids or food, cough, altered mental status or any concerns. No signs of systemic illness or infection. The patient is nontoxic-appearing on exam and vital signs are within normal limits. I have reviewed the triage vital signs and the nursing notes. Pertinent labs & imaging results that were available during my care of the patient were reviewed by me and considered in my medical decision making (see chart for details). After history, exam, and medical workup I feel the patient has been appropriately medically screened and is safe for discharge home. Pertinent diagnoses were discussed  with the patient. Patient was given return precautions. Rx / DC Orders ED Discharge Orders          Ordered    naproxen (NAPROSYN) 375 MG tablet  2 times daily with meals        05/01/21 0112              Zuria Fosdick, MD 05/01/21 6060

## 2021-05-11 DIAGNOSIS — M25561 Pain in right knee: Secondary | ICD-10-CM | POA: Diagnosis not present

## 2021-05-14 ENCOUNTER — Telehealth: Payer: Self-pay | Admitting: *Deleted

## 2021-05-14 ENCOUNTER — Ambulatory Visit (INDEPENDENT_AMBULATORY_CARE_PROVIDER_SITE_OTHER): Payer: Medicare Other | Admitting: Physician Assistant

## 2021-05-14 DIAGNOSIS — M25561 Pain in right knee: Secondary | ICD-10-CM | POA: Diagnosis not present

## 2021-05-14 DIAGNOSIS — Z01810 Encounter for preprocedural cardiovascular examination: Secondary | ICD-10-CM | POA: Diagnosis not present

## 2021-05-14 NOTE — Telephone Encounter (Signed)
I left a message for the pt to call back so that we may set up a tele visit with the pre op cardiologist today or tomorrow, as the surgery is 05/17/21 and we just received clearance request today. I will send FYI to requesting office that we are needing to s/w the pt before clearing.  ?

## 2021-05-14 NOTE — Telephone Encounter (Signed)
Pt agreeable to plan of care for tele pre op appt today at 3:40. Med rec and consent done.  ?

## 2021-05-14 NOTE — Telephone Encounter (Signed)
? ?  Pre-operative Risk Assessment  ?  ?Patient Name: Denise Hebert  ?DOB: 01-09-1946 ?MRN: [id number]  ? ?  ? ?Request for Surgical Clearance   ? ?Procedure:   RIGHT KNEE SCOPE ? ?Date of Surgery:  Clearance 05/17/21                              ?   ?Surgeon:  DR. Edmonia Lynch  ?Surgeon's Group or Practice Name:  [facility] ?Phone number:  [phone] ?Fax number:  [phone] ATTN: KELLY HIGH ?  ?Type of Clearance Requested:   ?- Medical  ?- Pharmacy:  Hold Aspirin   ?  ?Type of Anesthesia:   CHOICE ?  ?Additional requests/questions:   ? ?Signed, ?Julaine Hua   ?05/14/2021, 12:13 PM  ? ?

## 2021-05-14 NOTE — Telephone Encounter (Signed)
Callback pool, please contact the patient and add him to the telephone virtual visit with preop APP. Surgery is in 3 days, need to speak with the patient to clear him. Keep the current appointment with Tommye Standard on 4/4.  ?

## 2021-05-14 NOTE — Progress Notes (Signed)
? ?Virtual Visit via Telephone Note  ? ?This visit type was conducted due to national recommendations for restrictions regarding the COVID-19 Pandemic (e.g. social distancing) in an effort to limit this patient's exposure and mitigate transmission in our community.  Due to her co-morbid illnesses, this patient is at least at moderate risk for complications without adequate follow up.  This format is felt to be most appropriate for this patient at this time.  The patient did not have access to video technology/had technical difficulties with video requiring transitioning to audio format only (telephone).  All issues noted in this document were discussed and addressed.  No physical exam could be performed with this format.  Please refer to the patient's chart for her  consent to telehealth for [facility]. ?Evaluation Performed:  Preoperative cardiovascular risk assessment ? ?This visit type was conducted due to national recommendations for restrictions regarding the COVID-19 Pandemic (e.g. social distancing).  This format is felt to be most appropriate for this patient at this time.  All issues noted in this document were discussed and addressed.  No physical exam was performed (except for noted visual exam findings with Video Visits).  Please refer to the patient's chart (MyChart message for video visits and phone note for telephone visits) for the patient's consent to telehealth for [facility] ?_____________  ? ?Date:  05/14/2021  ? ?Patient ID:  Denise Hebert, Denise Hebert 06-15-1945, MRN [id number] ?Patient Location:  ?Home ?Provider location:   ?Office ? ?Primary Care Provider:  Kathyrn Lass, MD ?Primary Cardiologist:  None ?Electrophysiologist: Dr. Rayann Heman ? ?Chief Complaint  ?  ?76 y.o. y/o female with a h/o hypertension, hyperlipidemia, SVT, and RBBB, who is pending right knee scope, and presents today for telephonic preoperative cardiovascular risk assessment. ? ?Past Medical History  ?  ?Past Medical  History:  ?Diagnosis Date  ? Allergic rhinitis, cause unspecified   ? Atrial tachycardia ([place])   ? Back pain   ? Basal cell carcinoma   ? Calcium increased serum   ? Colon polyp   ? Hyperplastic cecal polyps  ? Colon polyp, hyperplastic   ? Cecal  ? HTN (hypertension)   ? Hyperlipidemia   ? IBS (irritable bowel syndrome)   ? Palpitations   ? Squamous cell skin cancer   ? SVT (supraventricular tachycardia) ([place])   ? ?Past Surgical History:  ?Procedure Laterality Date  ? APPENDECTOMY  1977  ? CHOLECYSTECTOMY  1977  ? ENDOMETRIAL BIOPSY  11/2002  ? ? ?Allergies ? ?Allergies  ?Allergen Reactions  ? Codeine   ?  GI intolerance ?  ? Penicillins Hives  ? ? ?History of Present Illness  ?  ?Denise Hebert is a 76 y.o. female who presents via audio/video conferencing for a telehealth visit today.  Pt was last seen in cardiology clinic on 06/08/2020, by Tommye Standard PA-C.  At that time OMESHA BOWERMAN was doing well without palpitation.  she is now pending right knee scope.  Since his last visit, she denies any recent chest pain worsening dyspnea.  Prior to her knee injury near the end of February, she was able to climb up 2 flight of stairs or walk 2 blocks away from her home and back without any issue. ? ? ?Home Medications  ?  ?Prior to Admission medications   ?Medication Sig Start Date End Date Taking? Authorizing Provider  ?acetaminophen (TYLENOL) 325 MG tablet Take 650 mg by mouth every 6 (six) hours as needed for mild pain.  [provider]  ?aspirin 81 MG tablet Take 81 mg by mouth daily. ?Patient not taking: Reported on 05/14/2021    [provider]  ?atorvastatin (LIPITOR) 10 MG tablet Take 10 mg by mouth daily. 04/13/18   [provider]  ?Cholecalciferol (VITAMIN D3) 1000 units CAPS Take 2,000 Units by mouth daily.    [provider]  ?ibuprofen (ADVIL,MOTRIN) 200 MG tablet Take 400 mg by mouth every 6 (six) hours as needed for moderate pain. ?Patient not taking: Reported on  05/14/2021    [provider]  ?losartan (COZAAR) 50 MG tablet TAKE 1 TABLET BY MOUTH EVERY DAY 06/14/20   Baldwin Jamaica, PA-C  ?meloxicam (MOBIC) 15 MG tablet Take 15 mg by mouth daily. 04/03/19   [provider]  ?Multiple Vitamin (MULTIVITAMIN) tablet Take 1 tablet by mouth daily. ?Patient not taking: Reported on 05/14/2021    [provider]  ?naproxen (NAPROSYN) 375 MG tablet Take 1 tablet (375 mg total) by mouth 2 (two) times daily with a meal. ?Patient not taking: Reported on 05/14/2021 05/01/21   Palumbo, April, MD  ?Omega-3 Fatty Acids (FISH OIL) 1000 MG CAPS Take 1 capsule by mouth.    [provider]  ?sodium chloride (OCEAN) 0.65 % SOLN nasal spray Place 1 spray into both nostrils as needed for congestion. Use as directed    [provider]  ?TIADYLT ER 120 MG 24 hr capsule TAKE 1 CAPSULE BY MOUTH EVERY DAY 06/21/20   Baldwin Jamaica, PA-C  ?TURMERIC PO Take 1 capsule by mouth daily.    [provider]  ? ? ?Physical Exam  ?  ?Vital Signs:  SARI COGAN does not have vital signs available for review today. ? ?Given telephonic nature of communication, physical exam is limited. ?AAOx3. NAD. Normal affect.  Speech and respirations are unlabored. ? ?Accessory Clinical Findings  ?  ?None ? ?Assessment & Plan  ?  ?1.  Preoperative Cardiovascular Risk Assessment: ? -Prior to her recent knee injury, she was able to walk more than 2 blocks away from her home and back and climb up 2 flight of stairs.  She is clearly able to accomplish more than 4 METS of activity.  She has no prior history of CAD.  She is cleared to proceed with knee surgery as a low risk patient.  She has discontinued her aspirin 9 months ago at the recommendation of her PCP.  She has not taken any turmeric or fish oil in the past few days. ? ?COVID-19 Education: ?The signs and symptoms of COVID-19 were discussed with the patient and how to seek care for testing (follow up with PCP or  arrange E-visit).  The importance of social distancing was discussed today. ? ?Patient Risk:   ?After full review of this patient's history and clinical status, I feel that he is at least moderate risk for cardiac complications at this time, thus necessitating a telehealth visit sooner than our first available in office visit. ? ?Time:   ?Today, I have spent 5 minutes with the patient with telehealth technology discussing medical history, symptoms, and management plan.   ? ? ?Almyra Deforest, [place] ? ?05/14/2021, 3:32 PM ? ?

## 2021-05-14 NOTE — Telephone Encounter (Signed)
?  Patient Consent for Virtual Visit  ? ? ?   ? ?Denise Hebert has provided verbal consent on 05/14/2021 for a virtual visit (video or telephone). ? ? ?CONSENT FOR VIRTUAL VISIT FOR:  Denise Hebert  ?By participating in this virtual visit I agree to the following: ? ?I hereby voluntarily request, consent and authorize [facility] and its employed or contracted physicians, physician assistants, nurse practitioners or other licensed health care professionals (the Practitioner), to provide me with telemedicine health care services (the ?Services") as deemed necessary by the treating Practitioner. I acknowledge and consent to receive the Services by the Practitioner via telemedicine. I understand that the telemedicine visit will involve communicating with the Practitioner through live audiovisual communication technology and the disclosure of certain medical information by electronic transmission. I acknowledge that I have been given the opportunity to request an in-person assessment or other available alternative prior to the telemedicine visit and am voluntarily participating in the telemedicine visit. ? ?I understand that I have the right to withhold or withdraw my consent to the use of telemedicine in the course of my care at any time, without affecting my right to future care or treatment, and that the Practitioner or I may terminate the telemedicine visit at any time. I understand that I have the right to inspect all information obtained and/or recorded in the course of the telemedicine visit and may receive copies of available information for a reasonable fee.  I understand that some of the potential risks of receiving the Services via telemedicine include:  ?Delay or interruption in medical evaluation due to technological equipment failure or disruption; ?Information transmitted may not be sufficient (e.g. poor resolution of images) to allow for appropriate medical decision making by the  Practitioner; and/or  ?In rare instances, security protocols could fail, causing a breach of personal health information. ? ?Furthermore, I acknowledge that it is my responsibility to provide information about my medical history, conditions and care that is complete and accurate to the best of my ability. I acknowledge that Practitioner's advice, recommendations, and/or decision may be based on factors not within their control, such as incomplete or inaccurate data provided by me or distortions of diagnostic images or specimens that may result from electronic transmissions. I understand that the practice of medicine is not an exact science and that Practitioner makes no warranties or guarantees regarding treatment outcomes. I acknowledge that a copy of this consent can be made available to me via my patient portal ([facility] [place]), or I can request a printed copy by calling the office of [facility].   ? ?I understand that my insurance will be billed for this visit.  ? ?I have read or had this consent read to me. ?I understand the contents of this consent, which adequately explains the benefits and risks of the Services being provided via telemedicine.  ?I have been provided ample opportunity to ask questions regarding this consent and the Services and have had my questions answered to my satisfaction. ?I give my informed consent for the services to be provided through the use of telemedicine in my medical care ? ? ? ?

## 2021-05-15 ENCOUNTER — Telehealth: Payer: Medicare Other

## 2021-05-17 DIAGNOSIS — X58XXXA Exposure to other specified factors, initial encounter: Secondary | ICD-10-CM | POA: Diagnosis not present

## 2021-05-17 DIAGNOSIS — G8918 Other acute postprocedural pain: Secondary | ICD-10-CM | POA: Diagnosis not present

## 2021-05-17 DIAGNOSIS — M94261 Chondromalacia, right knee: Secondary | ICD-10-CM | POA: Diagnosis not present

## 2021-05-17 DIAGNOSIS — S83241A Other tear of medial meniscus, current injury, right knee, initial encounter: Secondary | ICD-10-CM | POA: Diagnosis not present

## 2021-05-17 DIAGNOSIS — S83281A Other tear of lateral meniscus, current injury, right knee, initial encounter: Secondary | ICD-10-CM | POA: Diagnosis not present

## 2021-05-17 DIAGNOSIS — S83271A Complex tear of lateral meniscus, current injury, right knee, initial encounter: Secondary | ICD-10-CM | POA: Diagnosis not present

## 2021-05-17 DIAGNOSIS — Y999 Unspecified external cause status: Secondary | ICD-10-CM | POA: Diagnosis not present

## 2021-05-17 DIAGNOSIS — S83231A Complex tear of medial meniscus, current injury, right knee, initial encounter: Secondary | ICD-10-CM | POA: Diagnosis not present

## 2021-05-24 DIAGNOSIS — S83271D Complex tear of lateral meniscus, current injury, right knee, subsequent encounter: Secondary | ICD-10-CM | POA: Diagnosis not present

## 2021-05-24 DIAGNOSIS — M6281 Muscle weakness (generalized): Secondary | ICD-10-CM | POA: Diagnosis not present

## 2021-05-24 DIAGNOSIS — R269 Unspecified abnormalities of gait and mobility: Secondary | ICD-10-CM | POA: Diagnosis not present

## 2021-05-24 DIAGNOSIS — S83231D Complex tear of medial meniscus, current injury, right knee, subsequent encounter: Secondary | ICD-10-CM | POA: Diagnosis not present

## 2021-05-28 DIAGNOSIS — S83281D Other tear of lateral meniscus, current injury, right knee, subsequent encounter: Secondary | ICD-10-CM | POA: Diagnosis not present

## 2021-05-28 DIAGNOSIS — S83241D Other tear of medial meniscus, current injury, right knee, subsequent encounter: Secondary | ICD-10-CM | POA: Diagnosis not present

## 2021-06-04 NOTE — Progress Notes (Signed)
? ?Cardiology Office Note ?Date:  06/04/2021  ?Patient ID:  Denise, Hebert 07-Jul-1945, MRN [id number] ?PCP:  Kathyrn Lass, MD  ?Electrophysiologist:  Dr. Rayann Heman ? ? ?  ?Chief Complaint:  annual EP visit ? ? ?History of Present Illness: ?Denise Hebert is a 76 y.o. female with history of HTN, HLD, IBS, SVT, HTN, overweight, RBBB ? ?She comes today to be seen for Dr. Rayann Heman.  Last seen by him March 2020, she was doing well, SVT controlled with her current regime, the patient not interested in pursuing ablation.  Advised lifestyle modification for weight loss.  No changes were made to her medicines. ?Planned for annual APP visit ? ?I saw her 06/09/19 ?She is doing well.  She and her husband had many travel plans for the past year and this year and disappointed but cancelled them al for now.  Hoping to be able to get back at it "while we still can!". ?She is busy through her days, active around the house with no difficulties with her ADLs.  No specific exercising though. ? ?No palpitations.  No CP, SOB, DOE, no dizzy spells, near syncope or syncope. ?She is very comfortable with her current medicines and would not want to come off the dilt. ?She monitors her BP at home and usually runs 130-135 and 70's ? ?No changes were made, planned for anaul visits ? ?I saw her April 2022 ?She continues to do quite well. ?No symptoms of her SVT, infrequent skipped beats only. ?No CP, SOB, DOE ?No dizziness, near syncope ro syncope. ?She and her husband traveled this past year, went to [place], visited a couple parks, felt well with good exertional capacity. ?Labs done with her PMD annually ?Discussed PRN EP visits, she preferred to keep annual check-ins ? ?She saw H. Eulas Post, PA-C 05/14/21 in  tele-health, pre-op visit pending R knee surgery, outside of her knee, she was doing well without cardiac complaints. ? ?TODAY ?She is doing very well. ?Post knee surgery recovery has gone well. ?She has also had b/l cataracts and a Mohs  sx on a skin cancer on her nose. ?Her daughter is moving and they have been busy with that as well. ?No CP, or SVTs. ?Infrequently she has an awareness of her heart beat, not fast or irregular, just awareness. ?No near syncope or syncope. ? ?They are going to [place] in May ? ? ? ?Past Medical History:  ?Diagnosis Date  ? Allergic rhinitis, cause unspecified   ? Atrial tachycardia ([place])   ? Back pain   ? Basal cell carcinoma   ? Calcium increased serum   ? Colon polyp   ? Hyperplastic cecal polyps  ? Colon polyp, hyperplastic   ? Cecal  ? HTN (hypertension)   ? Hyperlipidemia   ? IBS (irritable bowel syndrome)   ? Palpitations   ? Squamous cell skin cancer   ? SVT (supraventricular tachycardia) ([place])   ? ? ?Past Surgical History:  ?Procedure Laterality Date  ? APPENDECTOMY  1977  ? CHOLECYSTECTOMY  1977  ? ENDOMETRIAL BIOPSY  11/2002  ? ? ?Current Outpatient Medications  ?Medication Sig Dispense Refill  ? acetaminophen (TYLENOL) 325 MG tablet Take 650 mg by mouth every 6 (six) hours as needed for mild pain.     ? atorvastatin (LIPITOR) 10 MG tablet Take 10 mg by mouth daily.    ? Cholecalciferol (VITAMIN D3) 1000 units CAPS Take 2,000 Units by mouth daily.    ? ibuprofen (ADVIL,MOTRIN) 200 MG tablet  Take 400 mg by mouth every 6 (six) hours as needed for moderate pain. (Patient not taking: Reported on 05/14/2021)    ? losartan (COZAAR) 50 MG tablet TAKE 1 TABLET BY MOUTH EVERY DAY 90 tablet 3  ? meloxicam (MOBIC) 15 MG tablet Take 15 mg by mouth daily.    ? Multiple Vitamin (MULTIVITAMIN) tablet Take 1 tablet by mouth daily. (Patient not taking: Reported on 05/14/2021)    ? naproxen (NAPROSYN) 375 MG tablet Take 1 tablet (375 mg total) by mouth 2 (two) times daily with a meal. (Patient not taking: Reported on 05/14/2021) 20 tablet 0  ? Omega-3 Fatty Acids (FISH OIL) 1000 MG CAPS Take 1 capsule by mouth.    ? sodium chloride (OCEAN) 0.65 % SOLN nasal spray Place 1 spray into both nostrils as needed for congestion. Use as  directed    ? TIADYLT ER 120 MG 24 hr capsule TAKE 1 CAPSULE BY MOUTH EVERY DAY 90 capsule 3  ? TURMERIC PO Take 1 capsule by mouth daily.    ? ?No current facility-administered medications for this visit.  ? ? ?Allergies:   Codeine and Penicillins  ? ?Social History:  The patient  reports that she has never smoked. She has never used smokeless tobacco. She reports current alcohol use. She reports that she does not use drugs.  ? ?Family History:  The patient's family history includes Alzheimer's disease in her mother; Colon cancer in her father; Congestive Heart Failure in her father; Diabetes Mellitus II in her father; Hypertension in her father and mother; Osteoarthritis in her sister. ? ?ROS:  Please see the history of present illness.  ?All other systems are reviewed and otherwise negative.  ? ?PHYSICAL EXAM:  ?VS:  There were no vitals taken for this visit. BMI: There is no height or weight on file to calculate BMI. ?Well nourished, well developed, in no acute distress  ?HEENT: normocephalic, atraumatic  ?Neck: no JVD, carotid bruits or masses ?Cardiac:  RRR; no significant murmurs, no rubs, or gallops ?Lungs:  CTA b/l, no wheezing, rhonchi or rales  ?Abd: soft, nontender ?MS: no deformity or atrophy ?Ext:   no edema  ?Skin: warm and dry, no rash ?Neuro:  No gross deficits appreciated ?Psych: euthymic mood, full affect ? ? ?EKG:  Done today and reviewed by myself shows  ?SR 70bpm, RBBB, LAD, no changes ? ?06/22/2013: TTE ?Study Conclusions  ?- Left ventricle: The cavity size was normal. Wall thickness  ?  was normal. Systolic function was normal. The estimated  ?  ejection fraction was in the range of 60% to 65%. Wall  ?  motion was normal; there were no regional wall motion  ?  abnormalities. Features are consistent with a pseudonormal  ?  left ventricular filling pattern, with concomitant  ?  abnormal relaxation and increased filling pressure (grade  ?  2 diastolic dysfunction).  ?- Aortic valve: There was no  stenosis.  ?- Mitral valve: Trivial regurgitation.  ?- Left atrium: The atrium was mildly dilated.  ?- Right ventricle: The cavity size was normal. Systolic  ?  function was normal.  ?- Tricuspid valve: Peak RV-RA gradient: 86m Hg (S).  ?- Pulmonary arteries: PA peak pressure: 245mHg (S).  ? ?Impressions:  ?- Normal LV size and systolic function, EF 6073-53%Moderate  ?  diastolic dysfunction. Normal RV size and systolic  ?  function. No significant valvular abnormalities.  ?Transthoracic echocardiography.  M-mode, complete 2D,  ?spectral Doppler, and color Doppler.  Height:  Height:  ?170.2cm. Height: 67in.  Weight:  Weight: 86.6kg. Weight:  ?190.6lb.  Body mass index:  BMI: 29.9kg/m^2.  Body surface  ?area:    BSA: 1.35m2.  Blood pressure:     157/81.  Patient  ?status:  Outpatient.  [facility]   ? ? ?Recent Labs: ?No results found for requested labs within last 8760 hours.  ?No results found for requested labs within last 8760 hours.  ? ?CrCl cannot be calculated (Patient's most recent lab result is older than the maximum 21 days allowed.).  ? ?Wt Readings from Last 3 Encounters:  ?04/30/21 210 lb 15.7 oz (95.7 kg)  ?06/08/20 211 lb (95.7 kg)  ?06/09/19 206 lb (93.4 kg)  ?  ? ?Other studies reviewed: ?Additional studies/records reviewed today include: summarized above ? ?ASSESSMENT AND PLAN: ? ?1. SVT ?     Well controlled with current regime ? ?2. HTN ?    Better at home 130's/70's reported ? ? ? ?Disposition: we will continue to see her annually, she is most comfortable going ahead and meeting a new MD with Dr. AJackalyn Lombardslow dow/departure. She has a friend that sees Dr. LQuentin Ore we will have her see him next year at her next appt. ? ?Current medicines are reviewed at length with the patient today.  The patient did not have any concerns regarding medicines. ? ?Signed, ?RTommye Standard PA-C ?06/04/2021 9:36 AM    ? ?[facility] ?[address] ?[id number]?[phone]  [phone] (office)  ?[phone](fax) ? ? ?

## 2021-06-05 ENCOUNTER — Encounter: Payer: Self-pay | Admitting: Physician Assistant

## 2021-06-05 ENCOUNTER — Ambulatory Visit (INDEPENDENT_AMBULATORY_CARE_PROVIDER_SITE_OTHER): Payer: Medicare Other | Admitting: Physician Assistant

## 2021-06-05 VITALS — BP 150/90 | HR 70 | Ht 66.0 in | Wt 212.0 lb

## 2021-06-05 DIAGNOSIS — I10 Essential (primary) hypertension: Secondary | ICD-10-CM | POA: Diagnosis not present

## 2021-06-05 DIAGNOSIS — Z79899 Other long term (current) drug therapy: Secondary | ICD-10-CM

## 2021-06-05 DIAGNOSIS — I471 Supraventricular tachycardia: Secondary | ICD-10-CM

## 2021-06-05 MED ORDER — DILTIAZEM HCL ER BEADS 120 MG PO CP24
ORAL_CAPSULE | ORAL | 3 refills | Status: DC
Start: 2021-06-05 — End: 2022-07-23

## 2021-06-05 NOTE — Patient Instructions (Signed)
Medication Instructions:  ? ?Your physician recommends that you continue on your current medications as directed. Please refer to the Current Medication list given to you today. ? ?*If you need a refill on your cardiac medications before your next appointment, please call your pharmacy* ? ? ?Lab Work: [facility] ? ? ?If you have labs (blood work) drawn today and your tests are completely normal, you will receive your results only by: ?MyChart Message (if you have MyChart) OR ?A paper copy in the mail ?If you have any lab test that is abnormal or we need to change your treatment, we will call you to review the results. ? ? ?Testing/Procedures: NONE ORDERED  TODAY ? ? ? ?Follow-Up: ?At [facility], you and your health needs are our priority.  As part of our continuing mission to provide you with exceptional heart care, we have created designated Provider Care Teams.  These Care Teams include your primary Cardiologist (physician) and Advanced Practice Providers (APPs -  Physician Assistants and Nurse Practitioners) who all work together to provide you with the care you need, when you need it. ? ?We recommend signing up for the patient portal called "MyChart".  Sign up information is provided on this After Visit Summary.  MyChart is used to connect with patients for Virtual Visits (Telemedicine).  Patients are able to view lab/test results, encounter notes, upcoming appointments, etc.  Non-urgent messages can be sent to your provider as well.   ?To learn more about what you can do with MyChart, go to [email or web address].   ? ?Your next appointment:   ?1 year(s) ? ?The format for your next appointment:   ? ? ?Provider:   ?Lars Mage, MD  ? ?

## 2021-06-08 ENCOUNTER — Other Ambulatory Visit: Payer: Self-pay | Admitting: Physician Assistant

## 2021-06-26 DIAGNOSIS — M064 Inflammatory polyarthropathy: Secondary | ICD-10-CM | POA: Diagnosis not present

## 2021-06-26 DIAGNOSIS — M1991 Primary osteoarthritis, unspecified site: Secondary | ICD-10-CM | POA: Diagnosis not present

## 2021-06-26 DIAGNOSIS — Z6834 Body mass index (BMI) 34.0-34.9, adult: Secondary | ICD-10-CM | POA: Diagnosis not present

## 2021-06-26 DIAGNOSIS — E669 Obesity, unspecified: Secondary | ICD-10-CM | POA: Diagnosis not present

## 2021-07-23 DIAGNOSIS — Z6835 Body mass index (BMI) 35.0-35.9, adult: Secondary | ICD-10-CM | POA: Diagnosis not present

## 2021-07-23 DIAGNOSIS — R600 Localized edema: Secondary | ICD-10-CM | POA: Diagnosis not present

## 2021-08-24 DIAGNOSIS — M25561 Pain in right knee: Secondary | ICD-10-CM | POA: Diagnosis not present

## 2021-08-24 DIAGNOSIS — M79661 Pain in right lower leg: Secondary | ICD-10-CM | POA: Diagnosis not present

## 2021-08-27 ENCOUNTER — Other Ambulatory Visit (HOSPITAL_COMMUNITY): Payer: Self-pay | Admitting: Orthopedic Surgery

## 2021-08-27 ENCOUNTER — Ambulatory Visit (HOSPITAL_COMMUNITY)
Admission: RE | Admit: 2021-08-27 | Discharge: 2021-08-27 | Disposition: A | Discharge status: Home / Self Care | Payer: Medicare Other | Source: Ambulatory Visit | Attending: Orthopedic Surgery | Admitting: Orthopedic Surgery

## 2021-08-27 DIAGNOSIS — M79604 Pain in right leg: Secondary | ICD-10-CM | POA: Diagnosis not present

## 2021-09-18 DIAGNOSIS — R600 Localized edema: Secondary | ICD-10-CM | POA: Diagnosis not present

## 2021-12-10 DIAGNOSIS — Z6835 Body mass index (BMI) 35.0-35.9, adult: Secondary | ICD-10-CM | POA: Diagnosis not present

## 2021-12-10 DIAGNOSIS — R600 Localized edema: Secondary | ICD-10-CM | POA: Diagnosis not present

## 2021-12-10 DIAGNOSIS — I10 Essential (primary) hypertension: Secondary | ICD-10-CM | POA: Diagnosis not present

## 2021-12-10 DIAGNOSIS — E7800 Pure hypercholesterolemia, unspecified: Secondary | ICD-10-CM | POA: Diagnosis not present

## 2021-12-10 DIAGNOSIS — Z23 Encounter for immunization: Secondary | ICD-10-CM | POA: Diagnosis not present

## 2021-12-26 DIAGNOSIS — M1991 Primary osteoarthritis, unspecified site: Secondary | ICD-10-CM | POA: Diagnosis not present

## 2021-12-26 DIAGNOSIS — Z79899 Other long term (current) drug therapy: Secondary | ICD-10-CM | POA: Diagnosis not present

## 2021-12-26 DIAGNOSIS — M064 Inflammatory polyarthropathy: Secondary | ICD-10-CM | POA: Diagnosis not present

## 2021-12-26 DIAGNOSIS — R600 Localized edema: Secondary | ICD-10-CM | POA: Diagnosis not present

## 2021-12-26 DIAGNOSIS — Z6834 Body mass index (BMI) 34.0-34.9, adult: Secondary | ICD-10-CM | POA: Diagnosis not present

## 2021-12-26 DIAGNOSIS — E669 Obesity, unspecified: Secondary | ICD-10-CM | POA: Diagnosis not present

## 2022-01-07 DIAGNOSIS — Z1231 Encounter for screening mammogram for malignant neoplasm of breast: Secondary | ICD-10-CM | POA: Diagnosis not present

## 2022-01-11 DIAGNOSIS — Z961 Presence of intraocular lens: Secondary | ICD-10-CM | POA: Diagnosis not present

## 2022-01-11 DIAGNOSIS — H52203 Unspecified astigmatism, bilateral: Secondary | ICD-10-CM | POA: Diagnosis not present

## 2022-01-11 DIAGNOSIS — H43813 Vitreous degeneration, bilateral: Secondary | ICD-10-CM | POA: Diagnosis not present

## 2022-01-11 DIAGNOSIS — H26493 Other secondary cataract, bilateral: Secondary | ICD-10-CM | POA: Diagnosis not present

## 2022-01-11 DIAGNOSIS — H524 Presbyopia: Secondary | ICD-10-CM | POA: Diagnosis not present

## 2022-01-14 DIAGNOSIS — Z1389 Encounter for screening for other disorder: Secondary | ICD-10-CM | POA: Diagnosis not present

## 2022-01-14 DIAGNOSIS — Z0000 Encounter for general adult medical examination without abnormal findings: Secondary | ICD-10-CM | POA: Diagnosis not present

## 2022-01-14 DIAGNOSIS — Z23 Encounter for immunization: Secondary | ICD-10-CM | POA: Diagnosis not present

## 2022-01-18 DIAGNOSIS — R600 Localized edema: Secondary | ICD-10-CM | POA: Diagnosis not present

## 2022-01-18 DIAGNOSIS — E7800 Pure hypercholesterolemia, unspecified: Secondary | ICD-10-CM | POA: Diagnosis not present

## 2022-01-18 DIAGNOSIS — I10 Essential (primary) hypertension: Secondary | ICD-10-CM | POA: Diagnosis not present

## 2022-01-18 DIAGNOSIS — Z6835 Body mass index (BMI) 35.0-35.9, adult: Secondary | ICD-10-CM | POA: Diagnosis not present

## 2022-01-18 DIAGNOSIS — Z23 Encounter for immunization: Secondary | ICD-10-CM | POA: Diagnosis not present

## 2022-01-18 DIAGNOSIS — I471 Supraventricular tachycardia, unspecified: Secondary | ICD-10-CM | POA: Diagnosis not present

## 2022-05-01 DIAGNOSIS — L720 Epidermal cyst: Secondary | ICD-10-CM | POA: Diagnosis not present

## 2022-05-01 DIAGNOSIS — D2262 Melanocytic nevi of left upper limb, including shoulder: Secondary | ICD-10-CM | POA: Diagnosis not present

## 2022-05-01 DIAGNOSIS — L570 Actinic keratosis: Secondary | ICD-10-CM | POA: Diagnosis not present

## 2022-05-01 DIAGNOSIS — D1801 Hemangioma of skin and subcutaneous tissue: Secondary | ICD-10-CM | POA: Diagnosis not present

## 2022-05-01 DIAGNOSIS — D2272 Melanocytic nevi of left lower limb, including hip: Secondary | ICD-10-CM | POA: Diagnosis not present

## 2022-05-01 DIAGNOSIS — L821 Other seborrheic keratosis: Secondary | ICD-10-CM | POA: Diagnosis not present

## 2022-05-01 DIAGNOSIS — Z85828 Personal history of other malignant neoplasm of skin: Secondary | ICD-10-CM | POA: Diagnosis not present

## 2022-05-01 DIAGNOSIS — Z85820 Personal history of malignant melanoma of skin: Secondary | ICD-10-CM | POA: Diagnosis not present

## 2022-05-29 ENCOUNTER — Other Ambulatory Visit: Payer: Self-pay | Admitting: Physician Assistant

## 2022-06-11 DIAGNOSIS — D124 Benign neoplasm of descending colon: Secondary | ICD-10-CM | POA: Diagnosis not present

## 2022-06-11 DIAGNOSIS — Z1211 Encounter for screening for malignant neoplasm of colon: Secondary | ICD-10-CM | POA: Diagnosis not present

## 2022-06-11 DIAGNOSIS — D120 Benign neoplasm of cecum: Secondary | ICD-10-CM | POA: Diagnosis not present

## 2022-06-11 DIAGNOSIS — K5730 Diverticulosis of large intestine without perforation or abscess without bleeding: Secondary | ICD-10-CM | POA: Diagnosis not present

## 2022-06-11 DIAGNOSIS — K648 Other hemorrhoids: Secondary | ICD-10-CM | POA: Diagnosis not present

## 2022-06-13 DIAGNOSIS — D124 Benign neoplasm of descending colon: Secondary | ICD-10-CM | POA: Diagnosis not present

## 2022-06-13 DIAGNOSIS — D120 Benign neoplasm of cecum: Secondary | ICD-10-CM | POA: Diagnosis not present

## 2022-06-27 DIAGNOSIS — M064 Inflammatory polyarthropathy: Secondary | ICD-10-CM | POA: Diagnosis not present

## 2022-06-27 DIAGNOSIS — Z6834 Body mass index (BMI) 34.0-34.9, adult: Secondary | ICD-10-CM | POA: Diagnosis not present

## 2022-06-27 DIAGNOSIS — Z79899 Other long term (current) drug therapy: Secondary | ICD-10-CM | POA: Diagnosis not present

## 2022-06-27 DIAGNOSIS — E669 Obesity, unspecified: Secondary | ICD-10-CM | POA: Diagnosis not present

## 2022-06-27 DIAGNOSIS — M1991 Primary osteoarthritis, unspecified site: Secondary | ICD-10-CM | POA: Diagnosis not present

## 2022-07-23 ENCOUNTER — Other Ambulatory Visit: Payer: Self-pay | Admitting: Physician Assistant

## 2022-07-23 MED ORDER — DILTIAZEM HCL ER BEADS 120 MG PO CP24: ORAL_CAPSULE | ORAL | 0 refills | Status: DC

## 2022-08-08 NOTE — Progress Notes (Signed)
  Electrophysiology Office Follow up Visit Note:    Date:  08/09/2022   ID:  Denise Hebert, DOB 05/11/1945, MRN [id number]  PCP:  Sigmund Hazel, MD  [facility] Cardiologist:  None  [facility] Electrophysiologist:  Lanier Prude, MD    Interval History:    Denise Hebert is a 77 y.o. female who presents for a follow up visit.   Last seen 06/05/2021 by Luster Landsberg for SVT. She presents to establish with me, previously seen by JA.  Her SVT was diagnosed almost 10 years ago.  She has been on diltiazem 120 mg by mouth once daily.  She tolerates this medication well without side effects.  I take care of her husband as well who has atrial fibrillation.  He has had a prior ablation.      Past medical, surgical, social and family history were reviewed.  ROS:   Please see the history of present illness.    All other systems reviewed and are negative.     Physical Exam:    VS:  BP (!) 142/74   Pulse 70   Ht 5\' 6"  (1.676 m)   SpO2 96%   BMI 34.22 kg/m     Wt Readings from Last 3 Encounters:  06/05/21 212 lb (96.2 kg)  04/30/21 210 lb 15.7 oz (95.7 kg)  06/08/20 211 lb (95.7 kg)     GEN:  Well nourished, well developed in no acute distress CARDIAC: RRR, no murmurs, rubs, gallops RESPIRATORY:  Clear to auscultation without rales, wheezing or rhonchi       ASSESSMENT:    1. SVT (supraventricular tachycardia)   2. Primary hypertension    PLAN:    In order of problems listed above:   #SVT Continue diltiazem 120 mg by mouth once daily  #Hypertension At goal today.  Recommend checking blood pressures 1-2 times per week at home and recording the values.  Recommend bringing these recordings to the primary care physician. Continue losartan      Signed, Steffanie Dunn, MD, [facility], [facility] 08/09/2022 5:02 PM    Electrophysiology [facility]

## 2022-08-09 ENCOUNTER — Encounter: Payer: Self-pay | Admitting: Cardiology

## 2022-08-09 ENCOUNTER — Ambulatory Visit: Payer: Medicare Other | Attending: Cardiology | Admitting: Cardiology

## 2022-08-09 VITALS — BP 142/74 | HR 70 | Ht 66.0 in

## 2022-08-09 DIAGNOSIS — I10 Essential (primary) hypertension: Secondary | ICD-10-CM | POA: Insufficient documentation

## 2022-08-09 DIAGNOSIS — I471 Supraventricular tachycardia, unspecified: Secondary | ICD-10-CM | POA: Insufficient documentation

## 2022-08-09 NOTE — Patient Instructions (Signed)
Medication Instructions:  Your physician recommends that you continue on your current medications as directed. Please refer to the Current Medication list given to you today.  *If you need a refill on your cardiac medications before your next appointment, please call your pharmacy*  Follow-Up: At [facility], you and your health needs are our priority.  As part of our continuing mission to provide you with exceptional heart care, we have created designated Provider Care Teams.  These Care Teams include your primary Cardiologist (physician) and Advanced Practice Providers (APPs -  Physician Assistants and Nurse Practitioners) who all work together to provide you with the care you need, when you need it.  Your next appointment:   1 year(s)  Provider:   Cameron Lambert, MD    

## 2022-10-19 ENCOUNTER — Other Ambulatory Visit: Payer: Self-pay | Admitting: Cardiology

## 2022-10-21 MED ORDER — DILTIAZEM HCL ER BEADS 120 MG PO CP24: ORAL_CAPSULE | ORAL | 3 refills | Status: AC

## 2022-12-26 DIAGNOSIS — Z6833 Body mass index (BMI) 33.0-33.9, adult: Secondary | ICD-10-CM | POA: Diagnosis not present

## 2022-12-26 DIAGNOSIS — E669 Obesity, unspecified: Secondary | ICD-10-CM | POA: Diagnosis not present

## 2022-12-26 DIAGNOSIS — M1991 Primary osteoarthritis, unspecified site: Secondary | ICD-10-CM | POA: Diagnosis not present

## 2022-12-26 DIAGNOSIS — Z79899 Other long term (current) drug therapy: Secondary | ICD-10-CM | POA: Diagnosis not present

## 2022-12-26 DIAGNOSIS — M064 Inflammatory polyarthropathy: Secondary | ICD-10-CM | POA: Diagnosis not present

## 2023-01-08 DIAGNOSIS — Z23 Encounter for immunization: Secondary | ICD-10-CM | POA: Diagnosis not present

## 2023-01-13 DIAGNOSIS — I10 Essential (primary) hypertension: Secondary | ICD-10-CM | POA: Diagnosis not present

## 2023-01-13 DIAGNOSIS — Z1231 Encounter for screening mammogram for malignant neoplasm of breast: Secondary | ICD-10-CM | POA: Diagnosis not present

## 2023-01-13 DIAGNOSIS — E7800 Pure hypercholesterolemia, unspecified: Secondary | ICD-10-CM | POA: Diagnosis not present

## 2023-01-15 DIAGNOSIS — H43813 Vitreous degeneration, bilateral: Secondary | ICD-10-CM | POA: Diagnosis not present

## 2023-01-15 DIAGNOSIS — H52203 Unspecified astigmatism, bilateral: Secondary | ICD-10-CM | POA: Diagnosis not present

## 2023-01-15 DIAGNOSIS — H524 Presbyopia: Secondary | ICD-10-CM | POA: Diagnosis not present

## 2023-01-15 DIAGNOSIS — H04123 Dry eye syndrome of bilateral lacrimal glands: Secondary | ICD-10-CM | POA: Diagnosis not present

## 2023-01-15 DIAGNOSIS — H26493 Other secondary cataract, bilateral: Secondary | ICD-10-CM | POA: Diagnosis not present

## 2023-01-15 DIAGNOSIS — H0100A Unspecified blepharitis right eye, upper and lower eyelids: Secondary | ICD-10-CM | POA: Diagnosis not present

## 2023-01-17 DIAGNOSIS — I471 Supraventricular tachycardia, unspecified: Secondary | ICD-10-CM | POA: Diagnosis not present

## 2023-01-17 DIAGNOSIS — M8580 Other specified disorders of bone density and structure, unspecified site: Secondary | ICD-10-CM | POA: Diagnosis not present

## 2023-01-17 DIAGNOSIS — R739 Hyperglycemia, unspecified: Secondary | ICD-10-CM | POA: Diagnosis not present

## 2023-01-17 DIAGNOSIS — R946 Abnormal results of thyroid function studies: Secondary | ICD-10-CM | POA: Diagnosis not present

## 2023-01-17 DIAGNOSIS — E7800 Pure hypercholesterolemia, unspecified: Secondary | ICD-10-CM | POA: Diagnosis not present

## 2023-01-17 DIAGNOSIS — R202 Paresthesia of skin: Secondary | ICD-10-CM | POA: Diagnosis not present

## 2023-01-17 DIAGNOSIS — Z860100 Personal history of colon polyps, unspecified: Secondary | ICD-10-CM | POA: Diagnosis not present

## 2023-01-17 DIAGNOSIS — I10 Essential (primary) hypertension: Secondary | ICD-10-CM | POA: Diagnosis not present

## 2023-01-17 DIAGNOSIS — Z0000 Encounter for general adult medical examination without abnormal findings: Secondary | ICD-10-CM | POA: Diagnosis not present

## 2023-02-04 DIAGNOSIS — H26491 Other secondary cataract, right eye: Secondary | ICD-10-CM | POA: Diagnosis not present

## 2023-02-13 DIAGNOSIS — Z6834 Body mass index (BMI) 34.0-34.9, adult: Secondary | ICD-10-CM | POA: Diagnosis not present

## 2023-02-13 DIAGNOSIS — J209 Acute bronchitis, unspecified: Secondary | ICD-10-CM | POA: Diagnosis not present

## 2023-02-18 DIAGNOSIS — H26492 Other secondary cataract, left eye: Secondary | ICD-10-CM | POA: Diagnosis not present

## 2023-04-16 ENCOUNTER — Other Ambulatory Visit: Payer: Self-pay | Admitting: Physician Assistant

## 2023-04-21 DIAGNOSIS — I10 Essential (primary) hypertension: Secondary | ICD-10-CM | POA: Diagnosis not present

## 2023-04-21 DIAGNOSIS — M7662 Achilles tendinitis, left leg: Secondary | ICD-10-CM | POA: Diagnosis not present

## 2023-04-21 DIAGNOSIS — E538 Deficiency of other specified B group vitamins: Secondary | ICD-10-CM | POA: Diagnosis not present

## 2023-05-26 DIAGNOSIS — M25561 Pain in right knee: Secondary | ICD-10-CM | POA: Diagnosis not present

## 2023-05-26 DIAGNOSIS — M7662 Achilles tendinitis, left leg: Secondary | ICD-10-CM | POA: Diagnosis not present

## 2023-06-03 DIAGNOSIS — M25572 Pain in left ankle and joints of left foot: Secondary | ICD-10-CM | POA: Diagnosis not present

## 2023-06-03 DIAGNOSIS — M25561 Pain in right knee: Secondary | ICD-10-CM | POA: Diagnosis not present

## 2023-06-12 DIAGNOSIS — M25572 Pain in left ankle and joints of left foot: Secondary | ICD-10-CM | POA: Diagnosis not present

## 2023-06-16 DIAGNOSIS — M25572 Pain in left ankle and joints of left foot: Secondary | ICD-10-CM | POA: Diagnosis not present

## 2023-06-19 DIAGNOSIS — M25572 Pain in left ankle and joints of left foot: Secondary | ICD-10-CM | POA: Diagnosis not present

## 2023-06-24 DIAGNOSIS — M25572 Pain in left ankle and joints of left foot: Secondary | ICD-10-CM | POA: Diagnosis not present

## 2023-06-26 DIAGNOSIS — M25572 Pain in left ankle and joints of left foot: Secondary | ICD-10-CM | POA: Diagnosis not present

## 2023-06-30 DIAGNOSIS — Z79899 Other long term (current) drug therapy: Secondary | ICD-10-CM | POA: Diagnosis not present

## 2023-06-30 DIAGNOSIS — E669 Obesity, unspecified: Secondary | ICD-10-CM | POA: Diagnosis not present

## 2023-06-30 DIAGNOSIS — M1991 Primary osteoarthritis, unspecified site: Secondary | ICD-10-CM | POA: Diagnosis not present

## 2023-06-30 DIAGNOSIS — M064 Inflammatory polyarthropathy: Secondary | ICD-10-CM | POA: Diagnosis not present

## 2023-06-30 DIAGNOSIS — Z6834 Body mass index (BMI) 34.0-34.9, adult: Secondary | ICD-10-CM | POA: Diagnosis not present

## 2023-07-01 DIAGNOSIS — M25572 Pain in left ankle and joints of left foot: Secondary | ICD-10-CM | POA: Diagnosis not present

## 2023-07-03 DIAGNOSIS — M25572 Pain in left ankle and joints of left foot: Secondary | ICD-10-CM | POA: Diagnosis not present

## 2023-07-07 DIAGNOSIS — M25572 Pain in left ankle and joints of left foot: Secondary | ICD-10-CM | POA: Diagnosis not present

## 2023-07-10 DIAGNOSIS — M25572 Pain in left ankle and joints of left foot: Secondary | ICD-10-CM | POA: Diagnosis not present

## 2023-07-11 DIAGNOSIS — L821 Other seborrheic keratosis: Secondary | ICD-10-CM | POA: Diagnosis not present

## 2023-07-11 DIAGNOSIS — Z85828 Personal history of other malignant neoplasm of skin: Secondary | ICD-10-CM | POA: Diagnosis not present

## 2023-07-11 DIAGNOSIS — L720 Epidermal cyst: Secondary | ICD-10-CM | POA: Diagnosis not present

## 2023-07-11 DIAGNOSIS — L814 Other melanin hyperpigmentation: Secondary | ICD-10-CM | POA: Diagnosis not present

## 2023-07-11 DIAGNOSIS — L820 Inflamed seborrheic keratosis: Secondary | ICD-10-CM | POA: Diagnosis not present

## 2023-07-11 DIAGNOSIS — D1801 Hemangioma of skin and subcutaneous tissue: Secondary | ICD-10-CM | POA: Diagnosis not present

## 2023-07-11 DIAGNOSIS — Z85820 Personal history of malignant melanoma of skin: Secondary | ICD-10-CM | POA: Diagnosis not present

## 2023-07-15 DIAGNOSIS — M25561 Pain in right knee: Secondary | ICD-10-CM | POA: Diagnosis not present

## 2023-07-15 DIAGNOSIS — E7800 Pure hypercholesterolemia, unspecified: Secondary | ICD-10-CM | POA: Diagnosis not present

## 2023-07-15 DIAGNOSIS — Z6834 Body mass index (BMI) 34.0-34.9, adult: Secondary | ICD-10-CM | POA: Diagnosis not present

## 2023-07-15 DIAGNOSIS — M67874 Other specified disorders of tendon, left ankle and foot: Secondary | ICD-10-CM | POA: Diagnosis not present

## 2023-07-15 DIAGNOSIS — M130 Polyarthritis, unspecified: Secondary | ICD-10-CM | POA: Diagnosis not present

## 2023-07-15 DIAGNOSIS — E669 Obesity, unspecified: Secondary | ICD-10-CM | POA: Diagnosis not present

## 2023-07-15 DIAGNOSIS — I471 Supraventricular tachycardia, unspecified: Secondary | ICD-10-CM | POA: Diagnosis not present

## 2023-07-15 DIAGNOSIS — I10 Essential (primary) hypertension: Secondary | ICD-10-CM | POA: Diagnosis not present

## 2023-07-30 DIAGNOSIS — M25572 Pain in left ankle and joints of left foot: Secondary | ICD-10-CM | POA: Diagnosis not present

## 2023-08-01 DIAGNOSIS — M25572 Pain in left ankle and joints of left foot: Secondary | ICD-10-CM | POA: Diagnosis not present

## 2023-08-05 DIAGNOSIS — M25572 Pain in left ankle and joints of left foot: Secondary | ICD-10-CM | POA: Diagnosis not present

## 2023-08-07 DIAGNOSIS — M25572 Pain in left ankle and joints of left foot: Secondary | ICD-10-CM | POA: Diagnosis not present

## 2023-08-19 DIAGNOSIS — M25572 Pain in left ankle and joints of left foot: Secondary | ICD-10-CM | POA: Diagnosis not present

## 2023-08-28 DIAGNOSIS — M25572 Pain in left ankle and joints of left foot: Secondary | ICD-10-CM | POA: Diagnosis not present

## 2023-09-04 DIAGNOSIS — M25572 Pain in left ankle and joints of left foot: Secondary | ICD-10-CM | POA: Diagnosis not present

## 2023-09-27 ENCOUNTER — Other Ambulatory Visit: Payer: Self-pay | Admitting: Cardiology

## 2023-09-29 DIAGNOSIS — M25512 Pain in left shoulder: Secondary | ICD-10-CM | POA: Diagnosis not present

## 2023-10-13 ENCOUNTER — Other Ambulatory Visit: Payer: Self-pay | Admitting: Cardiology

## 2023-10-23 DIAGNOSIS — M25512 Pain in left shoulder: Secondary | ICD-10-CM | POA: Diagnosis not present

## 2023-10-29 DIAGNOSIS — M25512 Pain in left shoulder: Secondary | ICD-10-CM | POA: Diagnosis not present

## 2023-11-06 DIAGNOSIS — M25512 Pain in left shoulder: Secondary | ICD-10-CM | POA: Diagnosis not present

## 2023-11-13 DIAGNOSIS — M25512 Pain in left shoulder: Secondary | ICD-10-CM | POA: Diagnosis not present

## 2023-12-01 DIAGNOSIS — M25512 Pain in left shoulder: Secondary | ICD-10-CM | POA: Diagnosis not present

## 2023-12-02 DIAGNOSIS — M25562 Pain in left knee: Secondary | ICD-10-CM | POA: Diagnosis not present

## 2023-12-02 DIAGNOSIS — M25512 Pain in left shoulder: Secondary | ICD-10-CM | POA: Diagnosis not present

## 2023-12-04 DIAGNOSIS — M25512 Pain in left shoulder: Secondary | ICD-10-CM | POA: Diagnosis not present

## 2023-12-08 DIAGNOSIS — Z23 Encounter for immunization: Secondary | ICD-10-CM | POA: Diagnosis not present

## 2023-12-17 DIAGNOSIS — M62512 Muscle wasting and atrophy, not elsewhere classified, left shoulder: Secondary | ICD-10-CM | POA: Diagnosis not present

## 2023-12-17 DIAGNOSIS — M75122 Complete rotator cuff tear or rupture of left shoulder, not specified as traumatic: Secondary | ICD-10-CM | POA: Diagnosis not present

## 2023-12-18 DIAGNOSIS — M25512 Pain in left shoulder: Secondary | ICD-10-CM | POA: Diagnosis not present

## 2023-12-22 DIAGNOSIS — M064 Inflammatory polyarthropathy: Secondary | ICD-10-CM | POA: Diagnosis not present

## 2023-12-22 DIAGNOSIS — E669 Obesity, unspecified: Secondary | ICD-10-CM | POA: Diagnosis not present

## 2023-12-22 DIAGNOSIS — M1991 Primary osteoarthritis, unspecified site: Secondary | ICD-10-CM | POA: Diagnosis not present

## 2023-12-22 DIAGNOSIS — Z79899 Other long term (current) drug therapy: Secondary | ICD-10-CM | POA: Diagnosis not present

## 2023-12-22 DIAGNOSIS — Z6833 Body mass index (BMI) 33.0-33.9, adult: Secondary | ICD-10-CM | POA: Diagnosis not present

## 2023-12-22 DIAGNOSIS — M25512 Pain in left shoulder: Secondary | ICD-10-CM | POA: Diagnosis not present

## 2023-12-29 DIAGNOSIS — M064 Inflammatory polyarthropathy: Secondary | ICD-10-CM | POA: Diagnosis not present

## 2023-12-31 DIAGNOSIS — M25512 Pain in left shoulder: Secondary | ICD-10-CM | POA: Diagnosis not present

## 2024-01-16 DIAGNOSIS — H43813 Vitreous degeneration, bilateral: Secondary | ICD-10-CM | POA: Diagnosis not present

## 2024-01-16 DIAGNOSIS — H52203 Unspecified astigmatism, bilateral: Secondary | ICD-10-CM | POA: Diagnosis not present

## 2024-01-16 DIAGNOSIS — H524 Presbyopia: Secondary | ICD-10-CM | POA: Diagnosis not present

## 2024-01-16 DIAGNOSIS — Z961 Presence of intraocular lens: Secondary | ICD-10-CM | POA: Diagnosis not present

## 2024-01-16 DIAGNOSIS — H04123 Dry eye syndrome of bilateral lacrimal glands: Secondary | ICD-10-CM | POA: Diagnosis not present

## 2024-01-19 DIAGNOSIS — Z1231 Encounter for screening mammogram for malignant neoplasm of breast: Secondary | ICD-10-CM | POA: Diagnosis not present

## 2024-01-20 DIAGNOSIS — M8580 Other specified disorders of bone density and structure, unspecified site: Secondary | ICD-10-CM | POA: Diagnosis not present

## 2024-01-20 DIAGNOSIS — G4762 Sleep related leg cramps: Secondary | ICD-10-CM | POA: Diagnosis not present

## 2024-01-20 DIAGNOSIS — E669 Obesity, unspecified: Secondary | ICD-10-CM | POA: Diagnosis not present

## 2024-01-20 DIAGNOSIS — E7800 Pure hypercholesterolemia, unspecified: Secondary | ICD-10-CM | POA: Diagnosis not present

## 2024-01-20 DIAGNOSIS — Z0000 Encounter for general adult medical examination without abnormal findings: Secondary | ICD-10-CM | POA: Diagnosis not present

## 2024-01-20 DIAGNOSIS — M130 Polyarthritis, unspecified: Secondary | ICD-10-CM | POA: Diagnosis not present

## 2024-01-20 DIAGNOSIS — I10 Essential (primary) hypertension: Secondary | ICD-10-CM | POA: Diagnosis not present

## 2024-01-20 DIAGNOSIS — Z1331 Encounter for screening for depression: Secondary | ICD-10-CM | POA: Diagnosis not present

## 2024-03-08 ENCOUNTER — Other Ambulatory Visit: Payer: Self-pay | Admitting: Cardiology
